# Patient Record
Sex: Male | Born: 1960 | Race: White | Hispanic: No | Marital: Married | Smoking: Current every day smoker
Health system: Southern US, Community
[De-identification: ages and names within clinical notes are randomized; demographics above are authoritative.]

## PROBLEM LIST (undated history)

## (undated) DIAGNOSIS — E669 Obesity, unspecified: Secondary | ICD-10-CM

## (undated) DIAGNOSIS — R5381 Other malaise: Secondary | ICD-10-CM

## (undated) DIAGNOSIS — R079 Chest pain, unspecified: Secondary | ICD-10-CM

## (undated) DIAGNOSIS — J4489 Other specified chronic obstructive pulmonary disease: Secondary | ICD-10-CM

## (undated) DIAGNOSIS — M79609 Pain in unspecified limb: Secondary | ICD-10-CM

## (undated) DIAGNOSIS — F329 Major depressive disorder, single episode, unspecified: Secondary | ICD-10-CM

## (undated) DIAGNOSIS — R519 Headache, unspecified: Secondary | ICD-10-CM

## (undated) DIAGNOSIS — K3189 Other diseases of stomach and duodenum: Secondary | ICD-10-CM

## (undated) DIAGNOSIS — N189 Chronic kidney disease, unspecified: Secondary | ICD-10-CM

## (undated) DIAGNOSIS — F528 Other sexual dysfunction not due to a substance or known physiological condition: Secondary | ICD-10-CM

## (undated) DIAGNOSIS — E663 Overweight: Secondary | ICD-10-CM

## (undated) DIAGNOSIS — R062 Wheezing: Secondary | ICD-10-CM

## (undated) DIAGNOSIS — R5383 Other fatigue: Secondary | ICD-10-CM

## (undated) DIAGNOSIS — F3289 Other specified depressive episodes: Secondary | ICD-10-CM

## (undated) DIAGNOSIS — E785 Hyperlipidemia, unspecified: Secondary | ICD-10-CM

## (undated) DIAGNOSIS — I10 Essential (primary) hypertension: Secondary | ICD-10-CM

## (undated) DIAGNOSIS — L989 Disorder of the skin and subcutaneous tissue, unspecified: Secondary | ICD-10-CM

## (undated) DIAGNOSIS — R7989 Other specified abnormal findings of blood chemistry: Secondary | ICD-10-CM

## (undated) DIAGNOSIS — Z87442 Personal history of urinary calculi: Secondary | ICD-10-CM

## (undated) DIAGNOSIS — R1013 Epigastric pain: Secondary | ICD-10-CM

## (undated) DIAGNOSIS — F17200 Nicotine dependence, unspecified, uncomplicated: Secondary | ICD-10-CM

## (undated) DIAGNOSIS — B354 Tinea corporis: Secondary | ICD-10-CM

## (undated) DIAGNOSIS — J449 Chronic obstructive pulmonary disease, unspecified: Secondary | ICD-10-CM

## (undated) DIAGNOSIS — R51 Headache: Secondary | ICD-10-CM

## (undated) HISTORY — DX: Nicotine dependence, unspecified, uncomplicated: F17.200

## (undated) HISTORY — DX: Chest pain, unspecified: R07.9

## (undated) HISTORY — DX: Other fatigue: R53.83

## (undated) HISTORY — DX: Essential (primary) hypertension: I10

## (undated) HISTORY — DX: Other malaise: R53.81

## (undated) HISTORY — DX: Hyperlipidemia, unspecified: E78.5

## (undated) HISTORY — DX: Disorder of the skin and subcutaneous tissue, unspecified: L98.9

## (undated) HISTORY — DX: Other specified depressive episodes: F32.89

## (undated) HISTORY — DX: Tinea corporis: B35.4

## (undated) HISTORY — DX: Other specified abnormal findings of blood chemistry: R79.89

## (undated) HISTORY — DX: Wheezing: R06.2

## (undated) HISTORY — DX: Other sexual dysfunction not due to a substance or known physiological condition: F52.8

## (undated) HISTORY — DX: Obesity, unspecified: E66.9

## (undated) HISTORY — DX: Overweight: E66.3

## (undated) HISTORY — DX: Other diseases of stomach and duodenum: K31.89

## (undated) HISTORY — DX: Chronic obstructive pulmonary disease, unspecified: J44.9

## (undated) HISTORY — DX: Pain in unspecified limb: M79.609

## (undated) HISTORY — DX: Other specified chronic obstructive pulmonary disease: J44.89

## (undated) HISTORY — DX: Major depressive disorder, single episode, unspecified: F32.9

## (undated) HISTORY — DX: Other diseases of stomach and duodenum: R10.13

---

## 1981-04-14 HISTORY — PX: OTHER SURGICAL HISTORY: SHX169

## 1983-04-15 HISTORY — PX: ANAL FISSURE REPAIR: SHX2312

## 1998-01-04 ENCOUNTER — Emergency Department (HOSPITAL_COMMUNITY): Admission: EM | Admit: 1998-01-04 | Discharge: 1998-01-04 | Payer: Self-pay | Admitting: Emergency Medicine

## 1998-01-04 ENCOUNTER — Encounter: Payer: Self-pay | Admitting: Emergency Medicine

## 1998-02-12 ENCOUNTER — Encounter: Payer: Self-pay | Admitting: Emergency Medicine

## 1998-02-12 ENCOUNTER — Emergency Department (HOSPITAL_COMMUNITY): Admission: EM | Admit: 1998-02-12 | Discharge: 1998-02-12 | Payer: Self-pay | Admitting: Emergency Medicine

## 1998-09-05 ENCOUNTER — Emergency Department (HOSPITAL_COMMUNITY): Admission: EM | Admit: 1998-09-05 | Discharge: 1998-09-05 | Payer: Self-pay | Admitting: Emergency Medicine

## 2010-07-18 ENCOUNTER — Other Ambulatory Visit: Payer: Self-pay | Admitting: Orthopedic Surgery

## 2010-07-18 ENCOUNTER — Ambulatory Visit (HOSPITAL_BASED_OUTPATIENT_CLINIC_OR_DEPARTMENT_OTHER)
Admission: RE | Admit: 2010-07-18 | Discharge: 2010-07-18 | Disposition: A | Discharge status: Home / Self Care | Payer: Self-pay | Source: Ambulatory Visit | Attending: Orthopedic Surgery | Admitting: Orthopedic Surgery

## 2010-07-18 DIAGNOSIS — D4819 Other specified neoplasm of uncertain behavior of connective and other soft tissue: Secondary | ICD-10-CM | POA: Insufficient documentation

## 2010-07-18 DIAGNOSIS — D481 Neoplasm of uncertain behavior of connective and other soft tissue: Secondary | ICD-10-CM | POA: Insufficient documentation

## 2012-07-29 ENCOUNTER — Other Ambulatory Visit: Payer: Self-pay

## 2012-07-30 ENCOUNTER — Encounter: Payer: Self-pay | Admitting: *Deleted

## 2012-08-02 ENCOUNTER — Ambulatory Visit (INDEPENDENT_AMBULATORY_CARE_PROVIDER_SITE_OTHER): Payer: Self-pay | Admitting: Internal Medicine

## 2012-08-02 ENCOUNTER — Encounter: Payer: Self-pay | Admitting: Internal Medicine

## 2012-08-02 VITALS — BP 160/92 | HR 93 | Temp 97.8°F | Resp 22 | Ht 73.0 in | Wt 253.0 lb

## 2012-08-02 DIAGNOSIS — F17200 Nicotine dependence, unspecified, uncomplicated: Secondary | ICD-10-CM

## 2012-08-02 DIAGNOSIS — B369 Superficial mycosis, unspecified: Secondary | ICD-10-CM

## 2012-08-02 DIAGNOSIS — K3189 Other diseases of stomach and duodenum: Secondary | ICD-10-CM

## 2012-08-02 DIAGNOSIS — F3289 Other specified depressive episodes: Secondary | ICD-10-CM

## 2012-08-02 DIAGNOSIS — E669 Obesity, unspecified: Secondary | ICD-10-CM

## 2012-08-02 DIAGNOSIS — R1013 Epigastric pain: Secondary | ICD-10-CM | POA: Insufficient documentation

## 2012-08-02 DIAGNOSIS — F329 Major depressive disorder, single episode, unspecified: Secondary | ICD-10-CM

## 2012-08-02 DIAGNOSIS — E785 Hyperlipidemia, unspecified: Secondary | ICD-10-CM

## 2012-08-02 DIAGNOSIS — E538 Deficiency of other specified B group vitamins: Secondary | ICD-10-CM

## 2012-08-02 DIAGNOSIS — I10 Essential (primary) hypertension: Secondary | ICD-10-CM

## 2012-08-02 MED ORDER — CLOTRIMAZOLE-BETAMETHASONE 1-0.05 % EX CREA: TOPICAL_CREAM | Freq: Two times a day (BID) | CUTANEOUS | Status: DC

## 2012-08-02 MED ORDER — HYDROCHLOROTHIAZIDE 12.5 MG PO TABS: 12.5000 mg | ORAL_TABLET | Freq: Every day | ORAL | Status: DC

## 2012-08-02 MED ORDER — PRAVASTATIN SODIUM 40 MG PO TABS: 40.0000 mg | ORAL_TABLET | Freq: Every day | ORAL | Status: DC

## 2012-08-02 MED ORDER — AMLODIPINE BESYLATE 10 MG PO TABS: 10.0000 mg | ORAL_TABLET | Freq: Every day | ORAL | Status: DC

## 2012-08-02 NOTE — Progress Notes (Signed)
Patient ID: Tom Lane, male   DOB: 1961-02-11, 52 y.o.   MRN: [id number] Code Status:  full  Allergies  Allergen Reactions  . Codeine     Chief Complaint  Patient presents with  . Medical Managment of Chronic Issues    has a raw place behind right ear    HPI: Patient is a 52 y.o. seen in the office today for chronic medical management.   Rash behind right ear since eyeglasses rubbed against it a couple years ago.  Has tried vaseline intensive care.    Actively smoking.  A lot less than a ppd now.  Mostly smokes in social setting.    Does watch his sodium intake and avoids fried foods.    Review of Systems:  Review of Systems  Constitutional: Negative for fever, chills and weight loss.  HENT: Negative for congestion.        Takes excedrin frequently.    Eyes: Negative for blurred vision.       Wears glasses  Respiratory: Negative for shortness of breath.   Cardiovascular: Negative for chest pain, palpitations and leg swelling.  Gastrointestinal: Positive for heartburn. Negative for constipation.  Genitourinary: Negative for dysuria, urgency and frequency.       Nocturia  Musculoskeletal: Positive for joint pain. Negative for falls.       Chronic right shoulder pain--h/o dislocation  Skin: Positive for rash.  Neurological: Positive for headaches. Negative for dizziness and weakness.  Endo/Heme/Allergies: Does not bruise/bleed easily.  Psychiatric/Behavioral: Negative for depression, memory loss and substance abuse. The patient is not nervous/anxious.    Past Medical History  Diagnosis Date  . Pain in limb   . Other abnormal blood chemistry   . Overweight   . Unspecified disorder of skin and subcutaneous tissue   . Chronic airway obstruction, not elsewhere classified   . Dermatophytosis of the body   . Wheezing   . Obesity, unspecified   . Other and unspecified hyperlipidemia   . Tobacco use disorder   . Unspecified essential hypertension   . Depressive disorder, not  elsewhere classified   . Psychosexual dysfunction with inhibited sexual excitement   . Other malaise and fatigue   . Chest pain, unspecified   . Dyspepsia and other specified disorders of function of stomach    Past Surgical History  Procedure Laterality Date  . Right renal stone removed  1983    Dr Aldean Ast  . Anal fissure repair  1985   Social History:   reports that he has been smoking Cigarettes.  He has a 45 pack-year smoking history. He does not have any smokeless tobacco history on file. He reports that  drinks alcohol. He reports that he does not use illicit drugs.  No family history on file.  Medications: Patient's Medications  New Prescriptions   No medications on file  Previous Medications   ASPIRIN 81 MG TABLET    Take 81 mg by mouth daily. Take one tablet once a day   CYANOCOBALAMIN 1000 MCG TABLET    Take 100 mcg by mouth daily. Take one tablet once a day   GARLIC 100 MG TABS    Take by mouth. Take one tablet once a day   MULTIVITAMIN (ONE-A-DAY MEN'S) TABS    Take 1 tablet by mouth daily. Take one tablet once a day   OMEGA-3 FATTY ACIDS (FISH OIL) 1000 MG CAPS    Take by mouth. Take one capsule twice a day   RED YEAST RICE  EXTRACT 600 MG TABS    Take by mouth. Take one tablet once a day  Modified Medications   Modified Medication Previous Medication   AMLODIPINE (NORVASC) 10 MG TABLET amLODipine (NORVASC) 10 MG tablet      Take 1 tablet (10 mg total) by mouth daily. Take one tablet once a day for blood pressure    Take 10 mg by mouth daily. Take one tablet once a day for blood pressure   PRAVASTATIN (PRAVACHOL) 40 MG TABLET pravastatin (PRAVACHOL) 40 MG tablet      Take 1 tablet (40 mg total) by mouth daily. Take one tablet once a day for cholesterol    Take 40 mg by mouth daily. Take one tablet once a day for cholesterol  Discontinued Medications   NYSTATIN-TRIAMCINOLONE (MYCOLOG II) CREAM    Apply topically 4 (four) times daily. Apply to infected area twice a day  until resolution   Physical Exam: Filed Vitals:   08/02/12 1517  BP: 160/92  Pulse: 93  Temp: 97.8 F (36.6 C)  TempSrc: Oral  Resp: 22  Height: 6\' 1"  (1.854 m)  Weight: 253 lb (114.76 kg)  SpO2: 96%   Physical Exam  Constitutional: He is oriented to person, place, and time.  HENT:  Head: Normocephalic and atraumatic.  Left Ear: External ear normal.  Nose: Nose normal.  Mouth/Throat: Oropharynx is clear and moist. No oropharyngeal exudate.  Right ear with excoriated area with white scaly material present posterior auricular area and some at external ear canal  Eyes: EOM are normal. Pupils are equal, round, and reactive to light.  Cardiovascular: Normal rate, regular rhythm, normal heart sounds and intact distal pulses.   Pulmonary/Chest: Effort normal and breath sounds normal. No respiratory distress.  Abdominal: Soft. Bowel sounds are normal. He exhibits no distension.  Musculoskeletal: He exhibits no edema.  Decreased rom shoulder  Neurological: He is alert and oriented to person, place, and time. No cranial nerve deficit.  Skin: Skin is warm and dry. Rash noted.  See ear above    Assessment/Plan 1. Obesity, unspecified -encouraged diet and exercise regimen for weight loss - check Hemoglobin A1c to ensure he is not prediabetic or diabetic  2. Other and unspecified hyperlipidemia -on red rice yeast tablets right now, pravachol and fish oil - check Lipid panel -advised that I did not recommend both red rice yeast and pravachol b/c of the same active ingredient increasing his risk of myopathy and transaminitis  3. Tobacco use disorder -advised slow taper off cigarettes, was not ready to quit, consider wellbutrin in future to help mood and smoking cessation - CBC with Differential  4. Unspecified essential hypertension - add hctz to amlodipine - hydrochlorothiazide (HYDRODIURIL) 12.5 MG tablet; Take 1 tablet (12.5 mg total) by mouth daily.  Dispense: 90 tablet; Refill:  3 - Comprehensive metabolic panel  5. Depressive disorder, not elsewhere classified -not currently on medications --consider SSRI if mood remains poor at future visit  6. Dyspepsia and other specified disorders of function of stomach --recommended as needed zantac after attempting to avoid triggers, avoid eating or drinking w/in a couple of hours of bedtime and elevating HOB  7. Chronic fungal otitis externa Rash behind ear - clotrimazole-betamethasone (LOTRISONE) cream; Apply topically 2 (two) times daily.  Dispense: 30 g; Refill: 3 -if does not improve, should see derm to r/o skin cancer due to longstanding presence  8. B12 deficiency Check level - Vitamin B12  Labs/tests ordered:  Cbc, cmp, flp, b12, hba1c

## 2012-08-03 LAB — COMPREHENSIVE METABOLIC PANEL
ALT: 30 IU/L (ref 0–44)
AST: 18 IU/L (ref 0–40)
Albumin/Globulin Ratio: 2 (ref 1.1–2.5)
Albumin: 4.5 g/dL (ref 3.5–5.5)
Alkaline Phosphatase: 66 IU/L (ref 39–117)
BUN/Creatinine Ratio: 21 — ABNORMAL HIGH (ref 9–20)
BUN: 19 mg/dL (ref 6–24)
CO2: 23 mmol/L (ref 19–28)
Calcium: 10.6 mg/dL — ABNORMAL HIGH (ref 8.7–10.2)
Chloride: 103 mmol/L (ref 97–108)
Creatinine, Ser: 0.91 mg/dL (ref 0.76–1.27)
GFR calc Af Amer: 112 mL/min/{1.73_m2} (ref >59)
GFR calc non Af Amer: 97 mL/min/{1.73_m2} (ref >59)
Globulin, Total: 2.2 g/dL (ref 1.5–4.5)
Glucose: 81 mg/dL (ref 65–99)
Potassium: 3.9 mmol/L (ref 3.5–5.2)
Sodium: 142 mmol/L (ref 134–144)
Total Bilirubin: 0.4 mg/dL (ref 0.0–1.2)
Total Protein: 6.7 g/dL (ref 6.0–8.5)

## 2012-08-03 LAB — CBC WITH DIFFERENTIAL/PLATELET
Basophils Absolute: 0 10*3/uL (ref 0.0–0.2)
Basos: 0 % (ref 0–3)
Eos: 3 % (ref 0–5)
Eosinophils Absolute: 0.2 10*3/uL (ref 0.0–0.4)
HCT: 48.3 % (ref 37.5–51.0)
Hemoglobin: 16.9 g/dL (ref 12.6–17.7)
Immature Grans (Abs): 0 10*3/uL (ref 0.0–0.1)
Immature Granulocytes: 0 % (ref 0–2)
Lymphocytes Absolute: 2.4 10*3/uL (ref 0.7–3.1)
Lymphs: 29 % (ref 14–46)
MCH: 31.4 pg (ref 26.6–33.0)
MCHC: 35 g/dL (ref 31.5–35.7)
MCV: 90 fL (ref 79–97)
Monocytes Absolute: 0.9 10*3/uL (ref 0.1–0.9)
Monocytes: 11 % (ref 4–12)
Neutrophils Absolute: 4.8 10*3/uL (ref 1.4–7.0)
Neutrophils Relative %: 57 % (ref 40–74)
RBC: 5.38 x10E6/uL (ref 4.14–5.80)
RDW: 14.3 % (ref 12.3–15.4)
WBC: 8.3 10*3/uL (ref 3.4–10.8)

## 2012-08-03 LAB — LIPID PANEL
Chol/HDL Ratio: 4.5 ratio units (ref 0.0–5.0)
Cholesterol, Total: 242 mg/dL — ABNORMAL HIGH (ref 100–199)
HDL: 54 mg/dL (ref >39)
LDL Calculated: 150 mg/dL — ABNORMAL HIGH (ref 0–99)
Triglycerides: 189 mg/dL — ABNORMAL HIGH (ref 0–149)
VLDL Cholesterol Cal: 38 mg/dL (ref 5–40)

## 2012-08-03 LAB — HEMOGLOBIN A1C
Est. average glucose Bld gHb Est-mCnc: 111 mg/dL
Hgb A1c MFr Bld: 5.5 % (ref 4.8–5.6)

## 2012-08-03 LAB — VITAMIN B12: Vitamin B-12: 930 pg/mL (ref 211–946)

## 2012-08-04 ENCOUNTER — Other Ambulatory Visit: Payer: Self-pay | Admitting: *Deleted

## 2012-08-04 MED ORDER — PRAVASTATIN SODIUM 80 MG PO TABS: ORAL_TABLET | ORAL | Status: DC

## 2012-09-13 ENCOUNTER — Other Ambulatory Visit: Payer: Self-pay | Admitting: *Deleted

## 2012-09-13 MED ORDER — PRAVASTATIN SODIUM 80 MG PO TABS: ORAL_TABLET | ORAL | Status: DC

## 2013-03-09 ENCOUNTER — Ambulatory Visit (INDEPENDENT_AMBULATORY_CARE_PROVIDER_SITE_OTHER): Payer: BC Managed Care – PPO | Admitting: Nurse Practitioner

## 2013-03-09 ENCOUNTER — Encounter: Payer: Self-pay | Admitting: Nurse Practitioner

## 2013-03-09 VITALS — BP 134/84 | HR 68 | Temp 98.3°F | Resp 20 | Ht 73.0 in | Wt 227.0 lb

## 2013-03-09 DIAGNOSIS — E669 Obesity, unspecified: Secondary | ICD-10-CM

## 2013-03-09 DIAGNOSIS — E785 Hyperlipidemia, unspecified: Secondary | ICD-10-CM

## 2013-03-09 DIAGNOSIS — F17200 Nicotine dependence, unspecified, uncomplicated: Secondary | ICD-10-CM

## 2013-03-09 DIAGNOSIS — I10 Essential (primary) hypertension: Secondary | ICD-10-CM

## 2013-03-09 NOTE — Patient Instructions (Addendum)
-  Decrease salt intake -Exercise 30 mins/5 days a week is recommended  -heart healthy diet - try to set a goal for only half a pack a day by a date and work towards that -if weight loss conts follow up sooner

## 2013-03-09 NOTE — Progress Notes (Signed)
Patient ID: Tom Lane, male   DOB: Apr 19, 1960, 52 y.o.   MRN: [id number]    Allergies  Allergen Reactions  . Codeine     Chief Complaint  Patient presents with  . Medical Managment of Chronic Issues    check blood pressure, was given 2 new medications last visit, wants to know if he still  needs them.  . Weight Loss    lost weight without trying. 08/02/12 wt was 253  . Herpes Zoster    went to Urgent Care-rash on chest around to back     HPI: Patient is a 52 y.o. male seen in the office today for routine follow up Has been eating better but eating more; cut back on fried foods reports it has been more in the last 2 weeks but reports he has had increase stress in his life and had weight loss last time this happened (wife having an affair) No increase in activity- exercise just includes parking further away and walking Still smoking- 1/2-1 ppd; does not want to quit smoking at this time but has decrease his amount  Review of Systems:  Review of Systems  Constitutional: Negative for fever, chills and weight loss.  HENT: Negative for congestion.   Eyes: Negative for blurred vision.       Wears glasses  Respiratory: Negative for shortness of breath.   Cardiovascular: Negative for chest pain, palpitations and leg swelling.  Gastrointestinal: Negative for heartburn, abdominal pain, diarrhea and constipation.  Genitourinary: Negative for dysuria, urgency and frequency.       Nocturia x1   Neurological: Negative for dizziness, weakness and headaches (headaches have improved).       Pain from shingles  Endo/Heme/Allergies: Does not bruise/bleed easily.  Psychiatric/Behavioral: Negative for depression, memory loss and substance abuse. The patient is not nervous/anxious.      Past Medical History  Diagnosis Date  . Pain in limb   . Other abnormal blood chemistry   . Overweight(278.02)   . Unspecified disorder of skin and subcutaneous tissue   . Chronic airway obstruction, not  elsewhere classified   . Dermatophytosis of the body   . Wheezing   . Obesity, unspecified   . Other and unspecified hyperlipidemia   . Tobacco use disorder   . Unspecified essential hypertension   . Depressive disorder, not elsewhere classified   . Psychosexual dysfunction with inhibited sexual excitement   . Other malaise and fatigue   . Chest pain, unspecified   . Dyspepsia and other specified disorders of function of stomach    Past Surgical History  Procedure Laterality Date  . Right renal stone removed  1983    Dr Aldean Ast  . Anal fissure repair  1985   Social History:   reports that he has been smoking Cigarettes.  He has a 30 pack-year smoking history. He has never used smokeless tobacco. He reports that he drinks alcohol. He reports that he does not use illicit drugs.  History reviewed. No pertinent family history.  Medications: Patient's Medications  New Prescriptions   No medications on file  Previous Medications   AMLODIPINE (NORVASC) 10 MG TABLET    Take 1 tablet (10 mg total) by mouth daily. Take one tablet once a day for blood pressure   ASPIRIN 81 MG TABLET    Take 81 mg by mouth daily. Take one tablet once a day   CLOTRIMAZOLE-BETAMETHASONE (LOTRISONE) CREAM    Apply topically 2 (two) times daily.   CYANOCOBALAMIN 1000 MCG  TABLET    Take 100 mcg by mouth daily. Take one tablet once a day   GARLIC 100 MG TABS    Take by mouth. Take one tablet once a day   HYDROCHLOROTHIAZIDE (HYDRODIURIL) 12.5 MG TABLET    Take 1 tablet (12.5 mg total) by mouth daily.   MULTIVITAMIN (ONE-A-DAY MEN'S) TABS    Take 1 tablet by mouth daily. Take one tablet once a day   OMEGA-3 FATTY ACIDS (FISH OIL) 1000 MG CAPS    Take by mouth. Take one capsule twice a day   PRAVASTATIN (PRAVACHOL) 80 MG TABLET    Take one tablet once daily to lower cholesterol   RED YEAST RICE EXTRACT 600 MG TABS    Take by mouth. Take one tablet once a day  Modified Medications   No medications on file    Discontinued Medications   No medications on file     Physical Exam:  Filed Vitals:   03/09/13 1013  BP: 134/84  Pulse: 68  Temp: 98.3 F (36.8 C)  TempSrc: Oral  Height: 6\' 1"  (1.854 m)  Weight: 227 lb (102.967 kg)    Physical Exam  Constitutional: He is oriented to person, place, and time and well-developed, well-nourished, and in no distress. No distress.  HENT:  Head: Normocephalic and atraumatic.  Mouth/Throat: Oropharynx is clear and moist. No oropharyngeal exudate.  Neck: Normal range of motion. Neck supple. No thyromegaly present.  Cardiovascular: Normal rate, regular rhythm and normal heart sounds.   Pulmonary/Chest: Effort normal and breath sounds normal.  Abdominal: Soft. Bowel sounds are normal. He exhibits no distension.  Musculoskeletal: He exhibits no edema and no tenderness.  Lymphadenopathy:    He has no cervical adenopathy.  Neurological: He is alert and oriented to person, place, and time.  Skin: Skin is warm and dry. He is not diaphoretic.  Psychiatric: Affect normal.     Labs reviewed: Basic Metabolic Panel:  Recent Labs  16/10/96 1629  NA 142  K 3.9  CL 103  CO2 23  GLUCOSE 81  BUN 19  CREATININE 0.91  CALCIUM 10.6*   Liver Function Tests:  Recent Labs  08/02/12 1629  AST 18  ALT 30  ALKPHOS 66  BILITOT 0.4  PROT 6.7   No results found for this basename: LIPASE, AMYLASE,  in the last 8760 hours No results found for this basename: AMMONIA,  in the last 8760 hours CBC:  Recent Labs  08/02/12 1629  WBC 8.3  NEUTROABS 4.8  HGB 16.9  HCT 48.3  MCV 90   Lipid Panel:  Recent Labs  08/02/12 1629  HDL 54  LDLCALC 150*  TRIG 189*  CHOLHDL 4.5   TSH: No results found for this basename: TSH,  in the last 8760 hours A1C: No components found with this basename: A1C,    Assessment/Plan 1. Unspecified essential hypertension -blood pressure at goal -cont heart healthy low sodium diet  - Comprehensive metabolic  panel  2. Other and unspecified hyperlipidemia -cont low fat diet -exercise discussed  -cont red yeast rice and simvastatin - Comprehensive metabolic panel - Lipid panel  3. Obesity, unspecified -has lost more weight in the last last 2 weeks -cont diet and exercise -if increased weight loss conts and pt has concerns to follow up sooner than 6 months  4. Tobacco use disorder -to set a goal to decrease amount of cigarettes per day (ie 1/2 pack a day) and work on making that goal -think about cessation  To follow up in 6 months or sooner if needed

## 2013-03-10 LAB — COMPREHENSIVE METABOLIC PANEL
AST: 17 IU/L (ref 0–40)
Albumin: 4.4 g/dL (ref 3.5–5.5)
Alkaline Phosphatase: 61 IU/L (ref 39–117)
BUN/Creatinine Ratio: 16 (ref 9–20)
BUN: 15 mg/dL (ref 6–24)
CO2: 22 mmol/L (ref 18–29)
Calcium: 10.4 mg/dL — ABNORMAL HIGH (ref 8.7–10.2)
Chloride: 103 mmol/L (ref 97–108)
Creatinine, Ser: 0.93 mg/dL (ref 0.76–1.27)
Glucose: 96 mg/dL (ref 65–99)

## 2013-03-10 LAB — LIPID PANEL
Cholesterol, Total: 142 mg/dL (ref 100–199)
HDL: 42 mg/dL (ref >39)
LDL Calculated: 84 mg/dL (ref 0–99)
Triglycerides: 79 mg/dL (ref 0–149)

## 2013-03-14 ENCOUNTER — Encounter: Payer: Self-pay | Admitting: *Deleted

## 2013-06-09 ENCOUNTER — Other Ambulatory Visit: Payer: Self-pay | Admitting: Internal Medicine

## 2013-11-22 ENCOUNTER — Other Ambulatory Visit: Payer: Self-pay | Admitting: Internal Medicine

## 2014-01-27 ENCOUNTER — Other Ambulatory Visit: Payer: Self-pay

## 2014-03-20 ENCOUNTER — Encounter: Payer: Self-pay | Admitting: Internal Medicine

## 2014-03-20 ENCOUNTER — Ambulatory Visit (INDEPENDENT_AMBULATORY_CARE_PROVIDER_SITE_OTHER): Payer: BC Managed Care – PPO | Admitting: Internal Medicine

## 2014-03-20 VITALS — BP 110/76 | HR 52 | Temp 97.5°F | Resp 10 | Ht 73.0 in | Wt 209.0 lb

## 2014-03-20 DIAGNOSIS — E669 Obesity, unspecified: Secondary | ICD-10-CM | POA: Diagnosis not present

## 2014-03-20 DIAGNOSIS — E785 Hyperlipidemia, unspecified: Secondary | ICD-10-CM

## 2014-03-20 DIAGNOSIS — R634 Abnormal weight loss: Secondary | ICD-10-CM | POA: Diagnosis not present

## 2014-03-20 DIAGNOSIS — Z23 Encounter for immunization: Secondary | ICD-10-CM

## 2014-03-20 DIAGNOSIS — F17200 Nicotine dependence, unspecified, uncomplicated: Secondary | ICD-10-CM

## 2014-03-20 DIAGNOSIS — Z720 Tobacco use: Secondary | ICD-10-CM

## 2014-03-20 DIAGNOSIS — J449 Chronic obstructive pulmonary disease, unspecified: Secondary | ICD-10-CM | POA: Diagnosis not present

## 2014-03-20 DIAGNOSIS — I10 Essential (primary) hypertension: Secondary | ICD-10-CM

## 2014-03-20 DIAGNOSIS — D2239 Melanocytic nevi of other parts of face: Secondary | ICD-10-CM

## 2014-03-20 DIAGNOSIS — D2339 Other benign neoplasm of skin of other parts of face: Secondary | ICD-10-CM

## 2014-03-20 MED ORDER — PRAVASTATIN SODIUM 80 MG PO TABS: ORAL_TABLET | ORAL | Status: DC

## 2014-03-20 MED ORDER — ZOSTER VACCINE LIVE 19400 UNT/0.65ML ~~LOC~~ SOLR: 0.6500 mL | Freq: Once | SUBCUTANEOUS | Status: DC

## 2014-03-20 MED ORDER — AMLODIPINE BESYLATE 10 MG PO TABS: 10.0000 mg | ORAL_TABLET | Freq: Every day | ORAL | Status: DC

## 2014-03-20 MED ORDER — BUPROPION HCL ER (XL) 150 MG PO TB24: 150.0000 mg | ORAL_TABLET | Freq: Every day | ORAL | Status: DC

## 2014-03-20 NOTE — Patient Instructions (Addendum)
Stop hydrochlorothiazide Check bp daily and let me know if it is then running consistently over 130/80  Start wellbutrin daily to help with smoking cessation  Please do a record release for records from Dr. Ival Bible office.

## 2014-03-20 NOTE — Progress Notes (Signed)
Patient ID: Tom Lane, male   DOB: 1960-09-01, 53 y.o.   MRN: [id number]   Location:  [facility] [facility]  Code Status: full code, does not have advance directive at this time  Allergies  Allergen Reactions  . Codeine     Chief Complaint  Patient presents with  . Establish Care    Re-establish and renew medications, was seeing Dr.Dewey . Patient is fasting if any labs due     HPI: Patient is a 53 y.o. male seen in the office today to reestablish with the practice.  He had a physical less than a year ago so we cannot do that today.   Questions need for hctz.  Had previously been drinking sodas but has quit them so would like to try stopping his hctz. Drinks mostly water, coffee and beer, but less beer.  Longtime family pet was put down last night and nobody slept. He also stopped eating junk.  Used to drink only diet mountain dew, sweet tea and coffee.  No longer drinks fluids constantly.  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    No longer takes niacin.  Was wetting the bed from sweat from the niacin--was flushing and diaphoretic.  Is NOT any longer. BP at home 110s-120s.    Requests prevnar vaccine.    Is fasting this am.  Left temple mole.  Square shaped bluish mole.    Potentially trying low dose wellbutrin for smoking cessation.  Smokes less and smokes outside primarily.  His wife also smokes less after getting really sick.  His daughter notes that she tolerates it well and neither she nor her father tolerated ssri (he was on zoloft).    Only 2-3 beers per week.  His daughter, Joellen Jersey will check his bp when we stop hctz  Review of Systems:  Review of Systems  Constitutional: Positive for weight loss.  HENT:       Dentures upcoming  Eyes:       Wears corrective lenses  Respiratory: Positive for wheezing.   Cardiovascular:       Hypertension, recently controlled very well  Gastrointestinal:       Flatulence and  hemorroids  Genitourinary: Negative for dysuria.       Weak stream  Musculoskeletal:       Restricted ROM  Skin:       Nail abnormality  Neurological: Positive for speech change.  Endo/Heme/Allergies: Positive for polydipsia.       Polyuria of large volumes     Past Medical History  Diagnosis Date  . Pain in limb   . Other abnormal blood chemistry   . Overweight(278.02)   . Unspecified disorder of skin and subcutaneous tissue   . Chronic airway obstruction, not elsewhere classified   . Dermatophytosis of the body   . Wheezing   . Obesity, unspecified   . Other and unspecified hyperlipidemia   . Tobacco use disorder   . Unspecified essential hypertension   . Depressive disorder, not elsewhere classified   . Psychosexual dysfunction with inhibited sexual excitement   . Other malaise and fatigue   . Chest pain, unspecified   . Dyspepsia and other specified disorders of function of stomach     Past Surgical History  Procedure Laterality Date  . Right renal stone removed  1983    Dr Serita Butcher  . Anal fissure repair  1985    Social History:  reports that he has been smoking Cigarettes.  He has a 40 pack-year smoking history. He has never used smokeless tobacco. He reports that he drinks alcohol. He reports that he does not use illicit drugs.  Family History  Problem Relation Age of Onset  . Diabetes Father   . Diabetes Sister   . Stroke Sister   . Stroke Father     Medications: Patient's Medications  New Prescriptions   No medications on file  Previous Medications   AMLODIPINE (NORVASC) 10 MG TABLET    Take 1 tablet (10 mg total) by mouth daily. Take one tablet once a day for blood pressure   ASPIRIN 81 MG TABLET    Take 81 mg by mouth daily. Take one tablet once a day   GARLIC 2297 MG CAPS    Take by mouth daily.   HYDROCHLOROTHIAZIDE (HYDRODIURIL) 12.5 MG TABLET    TAKE ONE TABLET BY MOUTH ONCE DAILY   MULTIVITAMIN (ONE-A-DAY MEN'S) TABS    Take 1 tablet by  mouth daily. Take one tablet once a day   OMEGA-3 FATTY ACIDS (FISH OIL) 1200 MG CAPS    Take by mouth daily.   PRAVASTATIN (PRAVACHOL) 80 MG TABLET    Take one tablet once daily to lower cholesterol   RED YEAST RICE EXTRACT (RED YEAST RICE PO)    Take 1,200 mg by mouth daily.  Modified Medications   No medications on file  Discontinued Medications   CLOTRIMAZOLE-BETAMETHASONE (LOTRISONE) CREAM    Apply topically 2 (two) times daily.   CYANOCOBALAMIN 1000 MCG TABLET    Take 100 mcg by mouth daily. Take one tablet once a day   GARLIC 989 MG TABS    Take by mouth. Take one tablet once a day   OMEGA-3 FATTY ACIDS (FISH OIL) 1000 MG CAPS    Take by mouth. Take one capsule twice a day   RED YEAST RICE EXTRACT 600 MG TABS    Take by mouth. Take one tablet once a day     Physical Exam: Filed Vitals:   03/20/14 0845  BP: 110/76  Pulse: 52  Temp: 97.5 F (36.4 C)  TempSrc: Oral  Resp: 10  Height: 6\' 1"  (1.854 m)  Weight: 209 lb (94.802 kg)  SpO2: 98%  Physical Exam  Constitutional: He is oriented to person, place, and time. He appears well-developed and well-nourished. No distress.  Eyes:  Wears glasses  Cardiovascular: Normal rate, regular rhythm, normal heart sounds and intact distal pulses.   Pulmonary/Chest: Effort normal and breath sounds normal. No respiratory distress.  Abdominal: Soft. Bowel sounds are normal. He exhibits no distension and no mass. There is no tenderness.  Musculoskeletal: Normal range of motion.  Neurological: He is alert and oriented to person, place, and time.  Skin:  Left temple square raised nevus  Psychiatric: He has a normal mood and affect.    Labs reviewed: Lab Results  Component Value Date   HGBA1C 5.5 08/02/2012   Assessment/Plan 1. Essential hypertension, benign -will stop hctz b/c bp running 211H systolic most of the time and he is bothered by polydipsia and polyuria - amLODipine (NORVASC) 10 MG tablet; Take 1 tablet (10 mg total) by mouth  daily. Take one tablet once a day for blood pressure  Dispense: 90 tablet; Refill: 1  2. Tobacco use disorder - is interested in trying to quit, will start wellbutrin - buPROPion (WELLBUTRIN XL) 150 MG 24 hr tablet; Take 1 tablet (150 mg total) by mouth  daily.  Dispense: 90 tablet; Refill: 1 - Pneumococcal conjugate vaccine 13-valent  3. Hyperlipidemia - cont pravachol, red rice yeast and fish oil and see where lipds are at this point--if very low, would reduce dose due to being on 80mg  pravachol - Hemoglobin A1c - Lipid panel - pravastatin (PRAVACHOL) 80 MG tablet; Take one tablet once daily  Dispense: 90 tablet; Refill: 1  4. Obesity - has lost 46 lbs since April of last year with dietary changes primarily--encouraged to continue these changes especially the decreased soda and tea that contributed - TSH  5. Chronic obstructive pulmonary disease, unspecified COPD, unspecified chronic bronchitis type - was previously diagnosed, but never had PFTs officially -was on spiriva at one time -not currently on any inhalers or therapy for this -smoking cessation encouraged and started on wellbutrin - CBC With differential/Platelet - Comprehensive metabolic panel  6. Atypical nevus of forehead -has changed over the years, and his daughter is especially concerned about it so will refer to derm for full skin assessment and evaluation and treatment of the left temple nevus - CBC With differential/Platelet - Ambulatory referral to Dermatology  7. Loss of weight -has made dietary changes, but also is smoker so monitor carefully--probably should have screening CT and should have PFTs due to probable COPD - CBC With differential/Platelet - TSH  8. Need for vaccination with 13-polyvalent pneumococcal conjugate vaccine - Pneumococcal conjugate vaccine 13-valent given, but refused flu shot  9. Need for vaccination for zoster -Rx given today to get at pharmacy--advised to call his insurance to  check where to get this  Labs/tests ordered:   Orders Placed This Encounter  Procedures  . Pneumococcal conjugate vaccine 13-valent  . CBC With differential/Platelet  . Comprehensive metabolic panel    Order Specific Question:  Has the patient fasted?    Answer:  Yes  . Hemoglobin A1c  . Lipid panel    Order Specific Question:  Has the patient fasted?    Answer:  Yes  . TSH  . Ambulatory referral to Dermatology    Referral Priority:  Routine    Referral Type:  Consultation    Referral Reason:  Specialty Services Required    Requested Specialty:  Dermatology    Number of Visits Requested:  1    Next appt:  3 mos  Janson Lamar L. Elody Kleinsasser, D.O. [facility] [address], [place] [address] Cell Phone (Mon-Fri 8am-5pm):  [phone] On Call:  [phone] & follow prompts after 5pm & weekends Office Phone:  [phone] Office Fax:  [phone]

## 2014-03-21 LAB — COMPREHENSIVE METABOLIC PANEL
ALT: 19 IU/L (ref 0–44)
AST: 19 IU/L (ref 0–40)
Albumin/Globulin Ratio: 2.5 (ref 1.1–2.5)
Albumin: 4.3 g/dL (ref 3.5–5.5)
Alkaline Phosphatase: 71 IU/L (ref 39–117)
BUN/Creatinine Ratio: 20 (ref 9–20)
BUN: 18 mg/dL (ref 6–24)
CO2: 25 mmol/L (ref 18–29)
Calcium: 10.2 mg/dL (ref 8.7–10.2)
Chloride: 102 mmol/L (ref 97–108)
Creatinine, Ser: 0.91 mg/dL (ref 0.76–1.27)
GFR calc Af Amer: 111 mL/min/{1.73_m2} (ref >59)
GFR calc non Af Amer: 96 mL/min/{1.73_m2} (ref >59)
Globulin, Total: 1.7 g/dL (ref 1.5–4.5)
Glucose: 94 mg/dL (ref 65–99)
Potassium: 4 mmol/L (ref 3.5–5.2)
Sodium: 140 mmol/L (ref 134–144)
Total Bilirubin: 0.4 mg/dL (ref 0.0–1.2)
Total Protein: 6 g/dL (ref 6.0–8.5)

## 2014-03-21 LAB — CBC WITH DIFFERENTIAL
Basophils Absolute: 0 10*3/uL (ref 0.0–0.2)
Basos: 0 %
Eos: 4 %
Eosinophils Absolute: 0.3 10*3/uL (ref 0.0–0.4)
HCT: 48.1 % (ref 37.5–51.0)
Hemoglobin: 16.5 g/dL (ref 12.6–17.7)
Immature Grans (Abs): 0 10*3/uL (ref 0.0–0.1)
Immature Granulocytes: 0 %
Lymphocytes Absolute: 2.9 10*3/uL (ref 0.7–3.1)
Lymphs: 35 %
MCH: 31.4 pg (ref 26.6–33.0)
MCHC: 34.3 g/dL (ref 31.5–35.7)
MCV: 92 fL (ref 79–97)
Monocytes Absolute: 0.8 10*3/uL (ref 0.1–0.9)
Monocytes: 10 %
Neutrophils Absolute: 4.2 10*3/uL (ref 1.4–7.0)
Neutrophils Relative %: 51 %
Platelets: 221 10*3/uL (ref 150–379)
RBC: 5.25 x10E6/uL (ref 4.14–5.80)
RDW: 14.1 % (ref 12.3–15.4)
WBC: 8.2 10*3/uL (ref 3.4–10.8)

## 2014-03-21 LAB — HEMOGLOBIN A1C
Est. average glucose Bld gHb Est-mCnc: 108 mg/dL
Hgb A1c MFr Bld: 5.4 % (ref 4.8–5.6)

## 2014-03-21 LAB — LIPID PANEL
Chol/HDL Ratio: 3.5 ratio units (ref 0.0–5.0)
Cholesterol, Total: 188 mg/dL (ref 100–199)
HDL: 54 mg/dL (ref >39)
LDL Calculated: 107 mg/dL — ABNORMAL HIGH (ref 0–99)
Triglycerides: 135 mg/dL (ref 0–149)
VLDL Cholesterol Cal: 27 mg/dL (ref 5–40)

## 2014-03-21 LAB — TSH: TSH: 0.813 u[IU]/mL (ref 0.450–4.500)

## 2014-03-22 ENCOUNTER — Other Ambulatory Visit: Payer: Self-pay | Admitting: *Deleted

## 2014-03-22 ENCOUNTER — Telehealth: Payer: Self-pay | Admitting: *Deleted

## 2014-03-22 DIAGNOSIS — E785 Hyperlipidemia, unspecified: Secondary | ICD-10-CM

## 2014-03-22 MED ORDER — ROSUVASTATIN CALCIUM 10 MG PO TABS: 10.0000 mg | ORAL_TABLET | Freq: Every day | ORAL | Status: DC

## 2014-03-22 NOTE — Telephone Encounter (Signed)
Spoke to the wife regarding patient lab results due to patient being on the road, informed her that Dr. Mariea Clonts would like to start him on a different medication. She stated that they would try crestor instead of lipitor due to cost, she ask if I would call it in to [facility] at the [facility] location. Dr Mariea Clonts , how many mg of crestor would you like to prescribe and when would you like to repeat his lab?

## 2014-03-22 NOTE — Telephone Encounter (Signed)
-----   Message from Gayland Curry, DO sent at 03/21/2014  9:00 AM EST ----- Blood counts, electrolytes, liver, kidneys, sugar average and thyroid are all normal. Bad cholesterol is still high even with pravachol 80mg , fish oil, and red yeast rice.  I would recommend a change in his statin to lipitor or crestor for increased efficacy, but these will cost more.  What does he think?

## 2014-03-22 NOTE — Telephone Encounter (Signed)
crestor 10mg  by mouth at evening meal  Recheck fasting lipid panel in about 6 weeks (can be a little before or after depending on his schedule on the road)

## 2014-05-23 ENCOUNTER — Telehealth: Payer: Self-pay | Admitting: *Deleted

## 2014-05-23 NOTE — Telephone Encounter (Signed)
1.  I changed him to the crestor b/c the cheapest cholesterol pill had been ineffective according to his last lab results.  2.  Do we have samples of the crestor?   2.  If not, change to lipitor (atorvastatin generic is fine) 40mg  at evening meal.

## 2014-05-23 NOTE — Telephone Encounter (Signed)
Patient is out of Cholesterol medication Crestor, but wants something else called in because it costs $45.00 to fill. Please Advise.

## 2014-05-24 MED ORDER — ATORVASTATIN CALCIUM 40 MG PO TABS: ORAL_TABLET | ORAL | Status: DC

## 2014-05-24 NOTE — Telephone Encounter (Signed)
No Samples available. Wants to try the Atorvastatin instead. Faxed to pharmacy

## 2014-06-19 ENCOUNTER — Ambulatory Visit: Payer: BC Managed Care – PPO | Admitting: Internal Medicine

## 2014-07-20 ENCOUNTER — Encounter: Payer: Self-pay | Admitting: Internal Medicine

## 2014-07-20 ENCOUNTER — Ambulatory Visit (INDEPENDENT_AMBULATORY_CARE_PROVIDER_SITE_OTHER): Payer: 59 | Admitting: Internal Medicine

## 2014-07-20 VITALS — BP 126/70 | HR 80 | Temp 97.7°F | Resp 20 | Ht 73.0 in | Wt 211.4 lb

## 2014-07-20 DIAGNOSIS — I10 Essential (primary) hypertension: Secondary | ICD-10-CM

## 2014-07-20 DIAGNOSIS — E785 Hyperlipidemia, unspecified: Secondary | ICD-10-CM

## 2014-07-20 DIAGNOSIS — Z720 Tobacco use: Secondary | ICD-10-CM | POA: Diagnosis not present

## 2014-07-20 DIAGNOSIS — Z1211 Encounter for screening for malignant neoplasm of colon: Secondary | ICD-10-CM

## 2014-07-20 MED ORDER — ATORVASTATIN CALCIUM 40 MG PO TABS: ORAL_TABLET | ORAL | Status: DC

## 2014-07-20 MED ORDER — AMLODIPINE BESYLATE 10 MG PO TABS: 10.0000 mg | ORAL_TABLET | Freq: Every day | ORAL | Status: DC

## 2014-07-20 NOTE — Progress Notes (Signed)
Patient ID: Tom Lane, male   DOB: 06/01/1960, 54 y.o.   MRN: [id number]   Location:  [facility]  Code Status: full code Goals of Care: Advanced Directive information Does patient have an advance directive?: No   Allergies  Allergen Reactions  . Codeine     Chief Complaint  Patient presents with  . Medical Management of Chronic Issues    HPI: Patient is a 54 y.o. white male seen in the office today for medical mgt of his chronic diseases.    Nothing much new.  Couldn't take wellbutrin.  Made him want like a cow.  Wanted to eat everything he saw.  Smoking very little--only a few cigarettes per day.    BP was good today with amlodipine.  Needs cholesterol rechecked.  Had been changed from pravachol to lipitor last time to see if we could get him to goal.  Denture creams burn the roof of his mouth so he's been unable to use them.  Advised to check with pharmacist.    Had his cscope at [facility] many years ago ~10 when he got sick off of the city water.  No polyps.    Still has not seen a dermatologist about the place on his left temple.  Has it finally next week.  Review of Systems:  Review of Systems  Constitutional: Negative for fever and chills.  HENT: Negative for hearing loss.   Eyes: Negative for blurred vision.  Respiratory: Negative for shortness of breath.   Cardiovascular: Negative for chest pain.  Gastrointestinal: Negative for abdominal pain, constipation, blood in stool and melena.  Genitourinary: Positive for frequency. Negative for dysuria and urgency.       Has to get up 2x at night  Musculoskeletal: Negative for myalgias and falls.  Skin:       Left temple nevus  Neurological: Negative for dizziness, loss of consciousness and headaches.  Endo/Heme/Allergies: Does not bruise/bleed easily.  Psychiatric/Behavioral: Negative for depression and memory loss.     Past Medical History  Diagnosis Date  . Pain in limb     . Other abnormal blood chemistry   . Overweight(278.02)   . Unspecified disorder of skin and subcutaneous tissue   . Chronic airway obstruction, not elsewhere classified   . Dermatophytosis of the body   . Wheezing   . Obesity, unspecified   . Other and unspecified hyperlipidemia   . Tobacco use disorder   . Unspecified essential hypertension   . Depressive disorder, not elsewhere classified   . Psychosexual dysfunction with inhibited sexual excitement   . Other malaise and fatigue   . Chest pain, unspecified   . Dyspepsia and other specified disorders of function of stomach     Past Surgical History  Procedure Laterality Date  . Right renal stone removed  1983    Dr Serita Butcher  . Anal fissure repair  1985    Social History:   reports that he has been smoking Cigarettes.  He has a 40 pack-year smoking history. He has never used smokeless tobacco. He reports that he drinks alcohol. He reports that he does not use illicit drugs.  Family History  Problem Relation Age of Onset  . Diabetes Father   . Diabetes Sister   . Stroke Sister   . Stroke Father     Medications: Patient's Medications  New Prescriptions   No medications on file  Previous Medications   AMLODIPINE (NORVASC) 10 MG TABLET  Take 1 tablet (10 mg total) by mouth daily. Take one tablet once a day for blood pressure   ASPIRIN 81 MG TABLET    Take 81 mg by mouth daily. Take one tablet once a day   ATORVASTATIN (LIPITOR) 40 MG TABLET    Take one tablet by mouth once daily for cholesterol   GARLIC 0175 MG CAPS    Take by mouth daily.   MULTIVITAMIN (ONE-A-DAY MEN'S) TABS    Take 1 tablet by mouth daily. Take one tablet once a day   OMEGA-3 FATTY ACIDS (FISH OIL) 1200 MG CAPS    Take by mouth daily.   ZOSTER VACCINE LIVE, PF, (ZOSTAVAX) 10258 UNT/0.65ML INJECTION    Inject 19,400 Units into the skin once.  Modified Medications   No medications on file  Discontinued Medications   BUPROPION (WELLBUTRIN XL) 150  MG 24 HR TABLET    Take 1 tablet (150 mg total) by mouth daily.   PRAVASTATIN (PRAVACHOL) 80 MG TABLET    Take one tablet once daily   RED YEAST RICE EXTRACT (RED YEAST RICE PO)    Take 1,200 mg by mouth daily.   Physical Exam: Filed Vitals:   07/20/14 1530  BP: 126/70  Pulse: 80  Temp: 97.7 F (36.5 C)  TempSrc: Oral  Resp: 20  Height: 6\' 1"  (1.854 m)  Weight: 211 lb 6.4 oz (95.89 kg)  SpO2: 96%  Physical Exam  Constitutional: He is oriented to person, place, and time. He appears well-developed and well-nourished. No distress.  Cardiovascular: Normal rate, regular rhythm, normal heart sounds and intact distal pulses.   Pulmonary/Chest: Effort normal and breath sounds normal. No respiratory distress.  Abdominal: Soft. Bowel sounds are normal. He exhibits no distension and no mass. There is no tenderness.  Musculoskeletal: Normal range of motion. He exhibits no edema or tenderness.  Neurological: He is alert and oriented to person, place, and time.  Skin: Skin is warm and dry.  Psychiatric: He has a normal mood and affect.    Labs reviewed: Basic Metabolic Panel:  Recent Labs  03/20/14 1005  NA 140  K 4.0  CL 102  CO2 25  GLUCOSE 94  BUN 18  CREATININE 0.91  CALCIUM 10.2  TSH 0.813   Liver Function Tests:  Recent Labs  03/20/14 1005  AST 19  ALT 19  ALKPHOS 71  BILITOT 0.4  PROT 6.0   No results for input(s): LIPASE, AMYLASE in the last 8760 hours. No results for input(s): AMMONIA in the last 8760 hours. CBC:  Recent Labs  03/20/14 1005  WBC 8.2  NEUTROABS 4.2  HGB 16.5  HCT 48.1  MCV 92  PLT 221   Lipid Panel:  Recent Labs  03/20/14 1005  CHOL 188  HDL 54  LDLCALC 107*  TRIG 135  CHOLHDL 3.5   Lab Results  Component Value Date   HGBA1C 5.4 03/20/2014    Assessment/Plan 1. Essential hypertension, benign - bp at goal with current therapy--renewed Rx - amLODipine (NORVASC) 10 MG tablet; Take 1 tablet (10 mg total) by mouth daily.  Take one tablet once a day for blood pressure  Dispense: 90 tablet; Refill: 1  2. Screening for colon cancer - Ambulatory referral to Gastroenterology - agrees to routine screening cscope -previously had one for complaint about 10 years ago at [facility]  3. Hyperlipidemia - will see what his lipids look like--if LDL still high, will increase lipitor to 80mg   - atorvastatin (LIPITOR) 40 MG tablet;  Take one tablet by mouth once daily for cholesterol  Dispense: 90 tablet; Refill: 1 - Lipid panel  4. Tobacco abuse -cont to cut back on cigarettes  Labs/tests ordered:   Orders Placed This Encounter  Procedures  . Lipid panel    Order Specific Question:  Has the patient fasted?    Answer:  Yes  . Ambulatory referral to Gastroenterology    Referral Priority:  Routine    Referral Type:  Consultation    Referral Reason:  Specialty Services Required    Requested Specialty:  Gastroenterology    Number of Visits Requested:  1    Next appt:  3 mos--he will call for appt  Miko Markwood L. Elianys Conry, D.O. [facility] [address], [place] [address] Cell Phone (Mon-Fri 8am-5pm):  [phone] On Call:  [phone] & follow prompts after 5pm & weekends Office Phone:  [phone] Office Fax:  [phone]

## 2014-07-20 NOTE — Patient Instructions (Signed)
Continue to cut back on cigarettes!

## 2014-07-21 LAB — LIPID PANEL
Chol/HDL Ratio: 3.5 ratio units (ref 0.0–5.0)
Cholesterol, Total: 177 mg/dL (ref 100–199)
HDL: 50 mg/dL (ref >39)
LDL Calculated: 106 mg/dL — ABNORMAL HIGH (ref 0–99)
Triglycerides: 105 mg/dL (ref 0–149)
VLDL Cholesterol Cal: 21 mg/dL (ref 5–40)

## 2014-07-24 ENCOUNTER — Other Ambulatory Visit: Payer: Self-pay

## 2014-07-24 MED ORDER — ATORVASTATIN CALCIUM 80 MG PO TABS: 80.0000 mg | ORAL_TABLET | Freq: Every day | ORAL | Status: DC

## 2014-10-13 ENCOUNTER — Other Ambulatory Visit: Payer: 59

## 2014-10-13 DIAGNOSIS — E785 Hyperlipidemia, unspecified: Secondary | ICD-10-CM

## 2014-10-14 LAB — LIPID PANEL
Chol/HDL Ratio: 2.3 ratio units (ref 0.0–5.0)
Cholesterol, Total: 116 mg/dL (ref 100–199)
HDL: 50 mg/dL (ref >39)
LDL Calculated: 52 mg/dL (ref 0–99)
Triglycerides: 72 mg/dL (ref 0–149)
VLDL Cholesterol Cal: 14 mg/dL (ref 5–40)

## 2014-10-19 ENCOUNTER — Ambulatory Visit (INDEPENDENT_AMBULATORY_CARE_PROVIDER_SITE_OTHER): Payer: 59 | Admitting: Internal Medicine

## 2014-10-19 ENCOUNTER — Encounter: Payer: Self-pay | Admitting: Internal Medicine

## 2014-10-19 VITALS — BP 128/88 | HR 84 | Temp 98.1°F | Resp 18 | Ht 73.0 in | Wt 213.0 lb

## 2014-10-19 DIAGNOSIS — E669 Obesity, unspecified: Secondary | ICD-10-CM

## 2014-10-19 DIAGNOSIS — E785 Hyperlipidemia, unspecified: Secondary | ICD-10-CM | POA: Diagnosis not present

## 2014-10-19 DIAGNOSIS — J449 Chronic obstructive pulmonary disease, unspecified: Secondary | ICD-10-CM | POA: Diagnosis not present

## 2014-10-19 DIAGNOSIS — R350 Frequency of micturition: Secondary | ICD-10-CM | POA: Diagnosis not present

## 2014-10-19 DIAGNOSIS — Z720 Tobacco use: Secondary | ICD-10-CM | POA: Diagnosis not present

## 2014-10-19 DIAGNOSIS — I10 Essential (primary) hypertension: Secondary | ICD-10-CM | POA: Diagnosis not present

## 2014-10-19 MED ORDER — ATORVASTATIN CALCIUM 80 MG PO TABS: 80.0000 mg | ORAL_TABLET | Freq: Every day | ORAL | Status: DC

## 2014-10-19 MED ORDER — AMLODIPINE BESYLATE 10 MG PO TABS: 10.0000 mg | ORAL_TABLET | Freq: Every day | ORAL | Status: DC

## 2014-10-19 NOTE — Progress Notes (Signed)
Patient ID: Tom Lane, male   DOB: September 15, 1960, 54 y.o.   MRN: [id number]   Location:  [facility]  Code Status: full code Goals of Care: Advanced Directive information Does patient have an advance directive?: No  Chief Complaint  Patient presents with  . Medical Management of Chronic Issues    follow-up for medical management ,labs printed    HPI: Patient is a 54 y.o.  White male seen in the office today for med mgt of his chronic illnesses.  He feels fine and has no complaints.  He had his colonoscopy with Dr. Benson Norway which showed polyps and requires a repeat in 2019.  I don't have the report only the note saying one would be done.    He continues to smoke though <1/2 ppd b/c doesn't smoke if he's busy working in the yard.  He drives a truck for a living.  He does not want to try chantix due to cost.  Wellbutrin made him gain weight.    His cholesterol was discussed and has improved--discussed dietary changes today to help lower the dosage of medication needed.  BP is down today with amlodipine and no smoking just before visit.    Review of Systems:  Review of Systems  Constitutional: Negative for fever and chills.  HENT: Negative for congestion.   Eyes: Negative for blurred vision.  Respiratory: Negative for shortness of breath.   Cardiovascular: Negative for chest pain and leg swelling.  Gastrointestinal: Negative for abdominal pain.  Genitourinary: Positive for frequency. Negative for dysuria and urgency.       Increased frequency at night but admits he probably drinks fluids too close to bedtime  Musculoskeletal: Negative for myalgias and falls.  Skin: Negative for rash.  Neurological: Negative for dizziness.  Psychiatric/Behavioral: Negative for memory loss.    Past Medical History  Diagnosis Date  . Pain in limb   . Other abnormal blood chemistry   . Overweight(278.02)   . Unspecified disorder of skin and subcutaneous tissue     . Chronic airway obstruction, not elsewhere classified   . Dermatophytosis of the body   . Wheezing   . Obesity, unspecified   . Other and unspecified hyperlipidemia   . Tobacco use disorder   . Unspecified essential hypertension   . Depressive disorder, not elsewhere classified   . Psychosexual dysfunction with inhibited sexual excitement   . Other malaise and fatigue   . Chest pain, unspecified   . Dyspepsia and other specified disorders of function of stomach     Past Surgical History  Procedure Laterality Date  . Right renal stone removed  1983    Dr Serita Butcher  . Anal fissure repair  1985    Allergies  Allergen Reactions  . Codeine    Medications: Patient's Medications  New Prescriptions   No medications on file  Previous Medications   ASPIRIN 81 MG TABLET    Take 81 mg by mouth daily. Take one tablet once a day   GARLIC 0962 MG CAPS    Take by mouth daily.   MULTIVITAMIN (ONE-A-DAY MEN'S) TABS    Take 1 tablet by mouth daily. Take one tablet once a day   OMEGA-3 FATTY ACIDS (FISH OIL) 1200 MG CAPS    Take by mouth daily.   ZOSTER VACCINE LIVE, PF, (ZOSTAVAX) 83662 UNT/0.65ML INJECTION    Inject 19,400 Units into the skin once.  Modified Medications   Modified Medication Previous Medication  AMLODIPINE (NORVASC) 10 MG TABLET amLODipine (NORVASC) 10 MG tablet      Take 1 tablet (10 mg total) by mouth daily. Take one tablet once a day for blood pressure    Take 1 tablet (10 mg total) by mouth daily. Take one tablet once a day for blood pressure   ATORVASTATIN (LIPITOR) 80 MG TABLET atorvastatin (LIPITOR) 80 MG tablet      Take 1 tablet (80 mg total) by mouth at bedtime. For High Cholesterol    Take 1 tablet (80 mg total) by mouth at bedtime. For High Cholesterol  Discontinued Medications   No medications on file    Physical Exam: Filed Vitals:   10/19/14 1015  BP: 128/88  Pulse: 84  Temp: 98.1 F (36.7 C)  TempSrc: Oral  Resp: 18  Height: 6\' 1"  (1.854 m)   Weight: 213 lb (96.616 kg)  SpO2: 95%   Physical Exam  Constitutional: He is oriented to person, place, and time. He appears well-developed and well-nourished.  Eyes:  glasses  Cardiovascular: Normal rate, regular rhythm, normal heart sounds and intact distal pulses.   Pulmonary/Chest: Effort normal and breath sounds normal. No respiratory distress. He has no wheezes.  Abdominal: Bowel sounds are normal.  Musculoskeletal: Normal range of motion. He exhibits no edema or tenderness.  Neurological: He is alert and oriented to person, place, and time.  Skin: Skin is warm and dry.    Labs reviewed: Basic Metabolic Panel:  Recent Labs  03/20/14 1005  NA 140  K 4.0  CL 102  CO2 25  GLUCOSE 94  BUN 18  CREATININE 0.91  CALCIUM 10.2  TSH 0.813   Liver Function Tests:  Recent Labs  03/20/14 1005  AST 19  ALT 19  ALKPHOS 71  BILITOT 0.4  PROT 6.0   No results for input(s): LIPASE, AMYLASE in the last 8760 hours. No results for input(s): AMMONIA in the last 8760 hours. CBC:  Recent Labs  03/20/14 1005  WBC 8.2  NEUTROABS 4.2  HGB 16.5  HCT 48.1  MCV 92  PLT 221   Lipid Panel:  Recent Labs  03/20/14 1005 07/20/14 1624 10/13/14 0912  CHOL 188 177 116  HDL 54 50 50  LDLCALC 107* 106* 52  TRIG 135 105 72  CHOLHDL 3.5 3.5 2.3   Lab Results  Component Value Date   HGBA1C 5.4 03/20/2014    Procedures since last visit: Had colonoscopy, Dr. Benson Norway:  Had 3 polyps.  Needs next one in 3 years.  Assessment/Plan 1. 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 Take one tablet once a day for blood pressure  Dispense: 90 tablet; Refill: 1  2. Hyperlipidemia -lipids finally at goal with lipitor max dose -counseled on dietary changes to help with this so he can take a lower dose in time--cut back on fried foods  which are the culprit for him - atorvastatin (LIPITOR) 80 MG tablet; Take 1 tablet (80 mg total) by mouth at bedtime. For High Cholesterol  Dispense: 90 tablet; Refill: 1  3. Tobacco abuse -smoking is the same -wellbutrin was helpful, but made him too hungry--ate straight for a week -smoking less than a 1/2 ppd - not ready for chantix due to cost -was again counseled on this  4. 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 Chronic obstructive pulmonary disease, unspecified COPD, unspecified chronic bronchitis type -suspected, will consider pulmonary referral if he develops more symptoms  6.  Urinary frequency -counseled to reduce drinks for at least 2 hours before bed -will also check psa with next labs  Labs/tests ordered:  No new--will do PSA and others day of his appt--advised to come fasting  Next appt:  6 mos  Tom Lane, D.O. [facility] [address], [place] [address] Cell Phone (Mon-Fri 8am-5pm):  [phone] On Call:  [phone] & follow prompts after 5pm & weekends Office Phone:  [phone] Office Fax:  [phone]

## 2015-02-05 ENCOUNTER — Other Ambulatory Visit: Payer: Self-pay | Admitting: Internal Medicine

## 2015-04-27 ENCOUNTER — Ambulatory Visit: Payer: 59 | Admitting: Internal Medicine

## 2015-07-02 ENCOUNTER — Ambulatory Visit: Payer: 59 | Admitting: Internal Medicine

## 2015-07-30 ENCOUNTER — Other Ambulatory Visit: Payer: Self-pay | Admitting: Internal Medicine

## 2015-07-31 ENCOUNTER — Other Ambulatory Visit: Payer: Self-pay | Admitting: Internal Medicine

## 2015-08-06 ENCOUNTER — Ambulatory Visit: Payer: 59 | Admitting: Internal Medicine

## 2015-10-15 ENCOUNTER — Ambulatory Visit: Payer: 59 | Admitting: Internal Medicine

## 2015-11-05 ENCOUNTER — Ambulatory Visit (INDEPENDENT_AMBULATORY_CARE_PROVIDER_SITE_OTHER): Payer: 59 | Admitting: Internal Medicine

## 2015-11-05 ENCOUNTER — Encounter: Payer: Self-pay | Admitting: Internal Medicine

## 2015-11-05 VITALS — BP 128/70 | HR 88 | Temp 98.3°F | Ht 73.0 in | Wt 206.0 lb

## 2015-11-05 DIAGNOSIS — Z87442 Personal history of urinary calculi: Secondary | ICD-10-CM

## 2015-11-05 DIAGNOSIS — R3911 Hesitancy of micturition: Secondary | ICD-10-CM | POA: Insufficient documentation

## 2015-11-05 DIAGNOSIS — E669 Obesity, unspecified: Secondary | ICD-10-CM

## 2015-11-05 DIAGNOSIS — E785 Hyperlipidemia, unspecified: Secondary | ICD-10-CM

## 2015-11-05 DIAGNOSIS — J449 Chronic obstructive pulmonary disease, unspecified: Secondary | ICD-10-CM

## 2015-11-05 DIAGNOSIS — I10 Essential (primary) hypertension: Secondary | ICD-10-CM | POA: Diagnosis not present

## 2015-11-05 DIAGNOSIS — F17200 Nicotine dependence, unspecified, uncomplicated: Secondary | ICD-10-CM | POA: Diagnosis not present

## 2015-11-05 LAB — CBC WITH DIFFERENTIAL/PLATELET
Basophils Absolute: 0 cells/uL (ref 0–200)
Basophils Relative: 0 %
Eosinophils Absolute: 308 cells/uL (ref 15–500)
Eosinophils Relative: 4 %
HCT: 47.8 % (ref 38.5–50.0)
Hemoglobin: 16.5 g/dL (ref 13.2–17.1)
Lymphocytes Relative: 32 %
Lymphs Abs: 2464 cells/uL (ref 850–3900)
MCH: 30.9 pg (ref 27.0–33.0)
MCHC: 34.5 g/dL (ref 32.0–36.0)
MCV: 89.5 fL (ref 80.0–100.0)
MPV: 9.5 fL (ref 7.5–12.5)
Monocytes Absolute: 616 cells/uL (ref 200–950)
Monocytes Relative: 8 %
Neutro Abs: 4312 cells/uL (ref 1500–7800)
Neutrophils Relative %: 56 %
Platelets: 198 10*3/uL (ref 140–400)
RBC: 5.34 MIL/uL (ref 4.20–5.80)
RDW: 14.6 % (ref 11.0–15.0)
WBC: 7.7 10*3/uL (ref 3.8–10.8)

## 2015-11-05 LAB — COMPREHENSIVE METABOLIC PANEL
ALT: 22 U/L (ref 9–46)
AST: 21 U/L (ref 10–35)
Albumin: 4 g/dL (ref 3.6–5.1)
Alkaline Phosphatase: 84 U/L (ref 40–115)
BUN: 24 mg/dL (ref 7–25)
CO2: 23 mmol/L (ref 20–31)
Calcium: 9.4 mg/dL (ref 8.6–10.3)
Chloride: 107 mmol/L (ref 98–110)
Creat: 1.09 mg/dL (ref 0.70–1.33)
Glucose, Bld: 88 mg/dL (ref 65–99)
Potassium: 4.4 mmol/L (ref 3.5–5.3)
Sodium: 139 mmol/L (ref 135–146)
Total Bilirubin: 0.5 mg/dL (ref 0.2–1.2)
Total Protein: 5.8 g/dL — ABNORMAL LOW (ref 6.1–8.1)

## 2015-11-05 LAB — LIPID PANEL
Cholesterol: 128 mg/dL (ref 125–200)
HDL: 51 mg/dL (ref >=40)
LDL Cholesterol: 64 mg/dL (ref <130)
Total CHOL/HDL Ratio: 2.5 Ratio (ref <=5.0)
Triglycerides: 67 mg/dL (ref <150)
VLDL: 13 mg/dL (ref <30)

## 2015-11-05 MED ORDER — ATORVASTATIN CALCIUM 80 MG PO TABS: 80.0000 mg | ORAL_TABLET | Freq: Every day | ORAL | 3 refills | Status: DC

## 2015-11-05 MED ORDER — ZOSTER VACCINE LIVE 19400 UNT/0.65ML ~~LOC~~ SUSR: 0.6500 mL | Freq: Once | SUBCUTANEOUS | 0 refills | Status: AC

## 2015-11-05 MED ORDER — AMLODIPINE BESYLATE 10 MG PO TABS: 10.0000 mg | ORAL_TABLET | Freq: Every day | ORAL | 3 refills | Status: DC

## 2015-11-05 NOTE — Progress Notes (Signed)
Location:  [facility] Provider:  Vlad Mayberry L. Mariea Clonts, D.O., C.M.D.  Code Status: full code Goals of Care:  Advanced Directives 11/05/2015  Does patient have an advance directive? No  Would patient like information on creating an advanced directive? No - patient declined information   Chief Complaint  Patient presents with  . Medical Management of Chronic Issues    medication refills, difficulty urinating    HPI: Patient is a 55 y.o. male seen today for medical management of chronic diseases.    Having difficulty urinating.  Sometimes it's like [place] and other times, he cannot go--will barely come out.  Getting up 2-3 times per night now when it used to be 1-2 like it used to be.    Doesn't get home until  2 hrs before bed to hold off on fluids so drinking just before bed.  At first, he did have some discomfort, but then it went away.  He did see very dark urine a few times, too, and he has had kidneys stones in the past.    Agrees to get zostavax now.    Smoking:  Still smoking, but less than he used to.    Hyperlipidemia:  We need to recheck the cholesterol  HTN:  bp at goal today.  Has lost 7 lbs in the past week.  Had been up to 220 at one point, he reports. Is mowing the yard and working outside when he gets home.    His job has changed and his insurance.  Now he's off Sunday and Monday weekly.    Past Medical History:  Diagnosis Date  . Chest pain, unspecified   . Chronic airway obstruction, not elsewhere classified   . Depressive disorder, not elsewhere classified   . Dermatophytosis of the body   . Dyspepsia and other specified disorders of function of stomach   . Obesity, unspecified   . Other abnormal blood chemistry   . Other and unspecified hyperlipidemia   . Other malaise and fatigue   . Overweight(278.02)   . Pain in limb   . Psychosexual dysfunction with inhibited sexual excitement   . Tobacco use disorder   . Unspecified disorder of skin and  subcutaneous tissue   . Unspecified essential hypertension   . Wheezing     Past Surgical History:  Procedure Laterality Date  . ANAL FISSURE REPAIR  1985  . right renal stone removed  1983   Dr Serita Butcher    Allergies  Allergen Reactions  . Codeine       Medication List       Accurate as of 11/05/15  1:47 PM. Always use your most recent med list.          amLODipine 10 MG tablet Commonly known as:  NORVASC Take 1 tablet (10 mg total) by mouth daily. Blood pressure   aspirin 81 MG tablet Take 81 mg by mouth daily. Take one tablet once a day   atorvastatin 80 MG tablet Commonly known as:  LIPITOR Take 1 tablet (80 mg total) by mouth daily at 6 PM. cholesterol   Fish Oil 1200 MG Caps Take by mouth daily.   Garlic 123XX123 MG Caps Take by mouth daily.   multivitamin Tabs tablet Take 1 tablet by mouth daily. Take one tablet once a day   Zoster Vaccine Live (PF) 19400 UNT/0.65ML injection Commonly known as:  ZOSTAVAX Inject 19,400 Units into the skin once.       Review of Systems:  Review of Systems  Constitutional: Positive for malaise/fatigue and weight loss. Negative for chills and fever.  HENT: Negative for congestion and hearing loss.   Eyes: Negative for blurred vision.       Glasses  Respiratory: Negative for cough and shortness of breath.   Cardiovascular: Positive for leg swelling. Negative for chest pain and palpitations.       Notes some days legs swell (sodium intake discussed)  Gastrointestinal: Negative for abdominal pain, blood in stool, constipation, diarrhea, melena, nausea and vomiting.  Genitourinary: Positive for frequency and hematuria. Negative for dysuria, flank pain and urgency.       Difficulty starting stream at times  Musculoskeletal: Negative for back pain, falls and joint pain.  Skin: Negative for itching and rash.  Neurological: Negative for dizziness, loss of consciousness, weakness and headaches.  Psychiatric/Behavioral:  Negative for depression and memory loss. The patient is not nervous/anxious and does not have insomnia.     Health Maintenance  Topic Date Due  . Hepatitis C Screening  [birth year]  . HIV Screening  09/18/1975  . INFLUENZA VACCINE  11/13/2015  . TETANUS/TDAP  04/15/2019  . COLONOSCOPY  09/06/2024    Physical Exam: Vitals:   11/05/15 1026  BP: 128/70  Pulse: 88  Temp: 98.3 F (36.8 C)  TempSrc: Oral  SpO2: 97%  Weight: 206 lb (93.4 kg)  Height: 6\' 1"  (1.854 m)   Body mass index is 27.18 kg/m. Physical Exam  Constitutional: He is oriented to person, place, and time. He appears well-developed and well-nourished. No distress.  HENT:  Head: Normocephalic and atraumatic.  Eyes: EOM are normal. Pupils are equal, round, and reactive to light.  glasses  Cardiovascular: Normal rate and intact distal pulses.   Murmur heard. Pulmonary/Chest: Effort normal and breath sounds normal. No respiratory distress.  Abdominal: Soft. Bowel sounds are normal. He exhibits no distension.  Musculoskeletal: Normal range of motion. He exhibits no edema.  Neurological: He is alert and oriented to person, place, and time.  Skin: Skin is warm and dry. Capillary refill takes less than 2 seconds.  Psychiatric: He has a normal mood and affect.    Labs reviewed: Basic Metabolic Panel: No results for input(s): NA, K, CL, CO2, GLUCOSE, BUN, CREATININE, CALCIUM, MG, PHOS, TSH in the last 8760 hours. Liver Function Tests: No results for input(s): AST, ALT, ALKPHOS, BILITOT, PROT, ALBUMIN in the last 8760 hours. No results for input(s): LIPASE, AMYLASE in the last 8760 hours. No results for input(s): AMMONIA in the last 8760 hours. CBC: No results for input(s): WBC, NEUTROABS, HGB, HCT, MCV, PLT in the last 8760 hours. Lipid Panel: No results for input(s): CHOL, HDL, LDLCALC, TRIG, CHOLHDL, LDLDIRECT in the last 8760 hours. Lab Results  Component Value Date   HGBA1C 5.4 03/20/2014     Assessment/Plan 1. Essential hypertension, benign - bp at goal with amlodipine (notes edema, but not daily so doubt med effect) -discussed watching sodium intake in his foods (prepared foods) - CBC with Differential/Platelet - Comprehensive metabolic panel  2. Chronic obstructive pulmonary disease, unspecified COPD type ([place]) -cont to monitor and reduced cigarettes  3. Obesity - has lost some weight since switching jobs and being about to do more work outside when he gets home and watching his diet better -seems a friend's illness has motivated him also - Hemoglobin A1c - Lipid panel  4. Hyperlipidemia -cont lipitor 80mg  -previously I had advised he should not take the red rice yeast with the lipitor b/c they  could cause cramping in combination or affect his liver (he does not recall this and has been taking both) - Lipid panel  5. Tobacco use disorder -cont to work on cutting back on cigarettes  6. Urinary hesitancy - has been becoming more problematic recently, plus, he has a friend who has to have his prostate removed for prostate cancer so he is concerned more about his now - PSA - Urinalysis with Reflex Microscopic  7. History of nephrolithiasis -has some hematuria a few months ago (gross) when he was having some dysuria, but that resolved since -will still send urine for micro to evaluate for signs of stones - PSA - Urinalysis with Reflex Microscopic  Labs/tests ordered:   Orders Placed This Encounter  Procedures  . PSA  . Urinalysis with Reflex Microscopic  . CBC with Differential/Platelet  . Comprehensive metabolic panel    Order Specific Question:   Has the patient fasted?    Answer:   Yes  . Hemoglobin A1c  . Lipid panel    Order Specific Question:   Has the patient fasted?    Answer:   Yes   Next appt:  03/03/2016 for annual   Stuckey. Demani Mcbrien, D.O. [facility] [address], [place]  [address] Cell Phone (Mon-Fri 8am-5pm):  [phone] On Call:  [phone] & follow prompts after 5pm & weekends Office Phone:  [phone] Office Fax:  [phone]

## 2015-11-06 LAB — URINALYSIS, MICROSCOPIC ONLY
Bacteria, UA: NONE SEEN [HPF]
Casts: NONE SEEN [LPF]
Crystals: NONE SEEN [HPF]
RBC / HPF: NONE SEEN RBC/HPF (ref <=2)
Squamous Epithelial / LPF: NONE SEEN [HPF] (ref <=5)
Yeast: NONE SEEN [HPF]

## 2015-11-06 LAB — URINALYSIS, ROUTINE W REFLEX MICROSCOPIC
Bilirubin Urine: NEGATIVE
Glucose, UA: NEGATIVE
Ketones, ur: NEGATIVE
Nitrite: NEGATIVE
Protein, ur: NEGATIVE
Specific Gravity, Urine: 1.013 (ref 1.001–1.035)
pH: 5.5 (ref 5.0–8.0)

## 2015-11-06 LAB — HEMOGLOBIN A1C
Hgb A1c MFr Bld: 5.6 % (ref <5.7)
Mean Plasma Glucose: 114 mg/dL

## 2015-11-06 LAB — PSA: PSA: 0.92 ng/mL (ref <=4.00)

## 2015-11-07 ENCOUNTER — Encounter: Payer: Self-pay | Admitting: *Deleted

## 2016-03-03 ENCOUNTER — Ambulatory Visit (INDEPENDENT_AMBULATORY_CARE_PROVIDER_SITE_OTHER): Payer: Commercial Managed Care - HMO | Admitting: Internal Medicine

## 2016-03-03 ENCOUNTER — Encounter: Payer: Self-pay | Admitting: Internal Medicine

## 2016-03-03 VITALS — BP 130/70 | HR 76 | Temp 98.1°F | Ht 73.0 in | Wt 208.0 lb

## 2016-03-03 DIAGNOSIS — Z23 Encounter for immunization: Secondary | ICD-10-CM

## 2016-03-03 DIAGNOSIS — I10 Essential (primary) hypertension: Secondary | ICD-10-CM

## 2016-03-03 DIAGNOSIS — J449 Chronic obstructive pulmonary disease, unspecified: Secondary | ICD-10-CM

## 2016-03-03 DIAGNOSIS — R310 Gross hematuria: Secondary | ICD-10-CM | POA: Diagnosis not present

## 2016-03-03 DIAGNOSIS — Z0000 Encounter for general adult medical examination without abnormal findings: Secondary | ICD-10-CM | POA: Diagnosis not present

## 2016-03-03 DIAGNOSIS — Z87442 Personal history of urinary calculi: Secondary | ICD-10-CM

## 2016-03-03 NOTE — Progress Notes (Signed)
Provider:  Rexene Edison. Mariea Clonts, D.O., C.M.D. Location:    [facility] of Service:   clinic  Previous PCP: Hollace Kinnier, DO Patient Care Team: Gayland Curry, DO as PCP - General (Geriatric Medicine) Carol Ada, MD as Consulting Physician (Gastroenterology)  Extended Emergency Contact Information Primary Emergency Contact: Karle Plumber Address: [address]          [place] [address] Johnnette Litter of [place] Phone: [phone] Relation: Spouse  Code Status:full code Goals of Care: Advanced Directive information Advanced Directives 11/05/2015  Does patient have an advance directive? No  Would patient like information on creating an advanced directive? No - patient declined information   Chief Complaint  Patient presents with  . Annual Exam    physical exam    HPI: Patient is a 55 y.o. male seen today for an annual physical exam.  Agreed to flu shot with some convincing.    BP was good today.  Even had been drinking coffee all morning and had a cigarette before coming.    No longer smoking a pack per day.  As long as he stays busy, he is fine.  He had a lot of blood in his urine last week.  He's had no pain with it.  Has had kidney stones before, but typically knows when that is going on.  Had burning last week.  Water was bright red.  He had been mowing the grass the day before.  Last time was early in the summer.  Had urgency at the time.  It gradually got lighter and now has quit.  DOT physical did show blood in his urine.    COPD:  Was on medication.  Can run a 1/2 mile before he gets winded.  He does run or walk to his truck.  Does not cough much.  Will have some phlegm.    Hyperlipidemia in July was improved.  Says he will eat wrong at the holidays.    Past Medical History:  Diagnosis Date  . Chest pain, unspecified   . Chronic airway obstruction, not elsewhere classified   . Depressive disorder, not elsewhere classified   . Dermatophytosis of the body    . Dyspepsia and other specified disorders of function of stomach   . Obesity, unspecified   . Other abnormal blood chemistry   . Other and unspecified hyperlipidemia   . Other malaise and fatigue   . Overweight(278.02)   . Pain in limb   . Psychosexual dysfunction with inhibited sexual excitement   . Tobacco use disorder   . Unspecified disorder of skin and subcutaneous tissue   . Unspecified essential hypertension   . Wheezing    Past Surgical History:  Procedure Laterality Date  . ANAL FISSURE REPAIR  1985  . right renal stone removed  1983   Dr Serita Butcher    reports that he has been smoking Cigarettes.  He has a 40.00 pack-year smoking history. He has never used smokeless tobacco. He reports that he drinks alcohol. He reports that he does not use drugs.  Functional Status Survey:    Family History  Problem Relation Age of Onset  . Diabetes Father   . Stroke Father   . Stroke Sister   . Diabetes Sister     Health Maintenance  Topic Date Due  . Hepatitis C Screening  [birth year]  . HIV Screening  09/18/1975  . TETANUS/TDAP  04/15/2019  . COLONOSCOPY  09/06/2024  . INFLUENZA VACCINE  Completed  Allergies  Allergen Reactions  . Codeine       Medication List       Accurate as of 03/03/16  2:06 PM. Always use your most recent med list.          amLODipine 10 MG tablet Commonly known as:  NORVASC Take 1 tablet (10 mg total) by mouth daily. Blood pressure   aspirin 81 MG tablet Take 81 mg by mouth daily. Take one tablet once a day   atorvastatin 80 MG tablet Commonly known as:  LIPITOR Take 1 tablet (80 mg total) by mouth daily at 6 PM. cholesterol   Fish Oil 1200 MG Caps Take by mouth daily.   Garlic 123XX123 MG Caps Take by mouth daily.   multivitamin Tabs tablet Take 1 tablet by mouth daily. Take one tablet once a day       Review of Systems  Constitutional: Negative for chills, fever, malaise/fatigue and weight loss.  HENT: Negative for  congestion and hearing loss.   Eyes: Negative for blurred vision.  Respiratory: Positive for cough. Negative for shortness of breath.   Cardiovascular: Negative for chest pain, palpitations and leg swelling.  Gastrointestinal: Negative for abdominal pain, blood in stool, constipation and melena.  Genitourinary: Positive for frequency and hematuria. Negative for dysuria and urgency.  Musculoskeletal: Negative for back pain, falls and joint pain.  Skin: Negative for itching and rash.  Neurological: Negative for dizziness, loss of consciousness and weakness.  Endo/Heme/Allergies: Does not bruise/bleed easily.  Psychiatric/Behavioral: Negative for depression and memory loss.    Vitals:   03/03/16 1342  BP: 130/70  Pulse: 76  Temp: 98.1 F (36.7 C)  TempSrc: Oral  SpO2: 97%  Weight: 208 lb (94.3 kg)  Height: 6\' 1"  (1.854 m)   Body mass index is 27.44 kg/m. Physical Exam  Constitutional: He is oriented to person, place, and time. He appears well-developed and well-nourished. No distress.  HENT:  Head: Normocephalic and atraumatic.  Right Ear: External ear normal.  Left Ear: External ear normal.  Nose: Nose normal.  Mouth/Throat: Oropharynx is clear and moist.  Small amt of cerumen in left ear  Eyes: Conjunctivae and EOM are normal. Pupils are equal, round, and reactive to light.  glasses  Neck: Neck supple. No JVD present.  Cardiovascular: Normal rate, regular rhythm, normal heart sounds and intact distal pulses.   Pulmonary/Chest: Effort normal and breath sounds normal. No respiratory distress.  Abdominal: Soft. Bowel sounds are normal. He exhibits no distension and no mass. There is no tenderness. There is no guarding.  Genitourinary:  Genitourinary Comments: No flank pain  Musculoskeletal: Normal range of motion.  Lymphadenopathy:    He has no cervical adenopathy.  Neurological: He is alert and oriented to person, place, and time. No cranial nerve deficit.  Skin: Skin is  warm and dry. Capillary refill takes less than 2 seconds.  Psychiatric: He has a normal mood and affect.    Labs reviewed: Basic Metabolic Panel:  Recent Labs  11/05/15 1143  NA 139  K 4.4  CL 107  CO2 23  GLUCOSE 88  BUN 24  CREATININE 1.09  CALCIUM 9.4   Liver Function Tests:  Recent Labs  11/05/15 1143  AST 21  ALT 22  ALKPHOS 84  BILITOT 0.5  PROT 5.8*  ALBUMIN 4.0   No results for input(s): LIPASE, AMYLASE in the last 8760 hours. No results for input(s): AMMONIA in the last 8760 hours. CBC:  Recent Labs  11/05/15 1143  WBC 7.7  NEUTROABS 4,312  HGB 16.5  HCT 47.8  MCV 89.5  PLT 198   Cardiac Enzymes: No results for input(s): CKTOTAL, CKMB, CKMBINDEX, TROPONINI in the last 8760 hours. BNP: Invalid input(s): POCBNP Lab Results  Component Value Date   HGBA1C 5.6 11/05/2015   Lab Results  Component Value Date   TSH 0.813 03/20/2014   Lab Results  Component Value Date   VITAMINB12 930 08/02/2012    Assessment/Plan 1. Annual physical exam - completed today and flu shot given, refuses pfts for now, needs pneumovax next time - Lipid panel; Future - CBC with Differential/Platelet; Future - COMPLETE METABOLIC PANEL WITH GFR; Future  2. Gross hematuria - new concern today that requires referral - Ambulatory referral to Urology--due to recurrent difficulty with hematuria now and being a smoker, needs eval - also has had kidney stones in the past but now w/o pain at all and has not notably passed any - CBC with Differential/Platelet; Future  3. History of nephrolithiasis - Ambulatory referral to Urology - CBC with Differential/Platelet; Future  4. Chronic obstructive pulmonary disease, unspecified COPD type ([place]) -should get PFTs, but does not feel like he's having a problem here--will prioritize this at a future visit  5. Essential hypertension, benign -bp well controlled with norvasc only - Lipid panel; Future - COMPLETE METABOLIC PANEL  WITH GFR; Future  6. Need for immunization against influenza - Flu Vaccine QUAD 36+ mos PF IM (Fluarix & Fluzone Quad PF)  Labs/tests ordered:   Orders Placed This Encounter  Procedures  . Flu Vaccine QUAD 36+ mos PF IM (Fluarix & Fluzone Quad PF)  . Lipid panel    Standing Status:   Future    Standing Expiration Date:   10/31/2016    Order Specific Question:   Has the patient fasted?    Answer:   Yes  . CBC with Differential/Platelet    Standing Status:   Future    Standing Expiration Date:   10/31/2016  . COMPLETE METABOLIC PANEL WITH GFR    [facility]    Standing Status:   Future    Standing Expiration Date:   10/31/2016  . Ambulatory referral to Urology    Referral Priority:   Routine    Referral Type:   Consultation    Referral Reason:   Specialty Services Required    Requested Specialty:   Urology    Number of Visits Requested:   [place]. Jona Zappone, D.O. [facility] [address], [place] [address] Cell Phone (Mon-Fri 8am-5pm):  [phone] On Call:  [phone] & follow prompts after 5pm & weekends Office Phone:  [phone] Office Fax:  [phone]

## 2016-03-31 ENCOUNTER — Other Ambulatory Visit: Payer: Self-pay

## 2016-03-31 MED ORDER — ZOSTER VACCINE LIVE 19400 UNT/0.65ML ~~LOC~~ SUSR: 0.6500 mL | Freq: Once | SUBCUTANEOUS | 0 refills | Status: AC

## 2016-04-18 ENCOUNTER — Other Ambulatory Visit: Payer: Self-pay | Admitting: Urology

## 2016-04-18 NOTE — Progress Notes (Signed)
Scheduling pre op-  Please PLACE SURGICAL ORDERS IN EPIC  THANKS

## 2016-04-22 ENCOUNTER — Other Ambulatory Visit (HOSPITAL_COMMUNITY): Payer: Commercial Managed Care - HMO

## 2016-04-22 NOTE — Patient Instructions (Addendum)
Tom Lane  04/22/2016   Your procedure is scheduled on: Thursday 04/24/2016  Report to [facility]  Entrance take [facility]  elevators to 3rd floor to  [place] at  145  PM.  Call this number if you have problems the morning of surgery [phone]   Remember: ONLY 1 PERSON MAY GO WITH YOU TO SHORT STAY TO GET  READY MORNING OF [place].   Do not eat food  :After Midnight.MAY HAVE CLEAR LIQUIDS FROM MIDNIGHT UP UNTIL 0945 AM THEN NOTHING UNTIL AFTER SURGERY!     CLEAR LIQUID DIET   Foods Allowed                                                                     Foods Excluded  Coffee and tea, regular and decaf                             liquids that you cannot  Plain Jell-O in any flavor                                             see through such as: Fruit ices (not with fruit pulp)                                     milk, soups, orange juice  Iced Popsicles                                    All solid food Carbonated beverages, regular and diet                                    Cranberry, grape and apple juices Sports drinks like Gatorade Lightly seasoned clear broth or consume(fat free) Sugar, honey syrup  Sample Menu Breakfast                                Lunch                                     Supper Cranberry juice                    Beef broth                            Chicken broth Jell-O                                     Grape juice  Apple juice Coffee or tea                        Jell-O                                      Popsicle                                                Coffee or tea                        Coffee or tea  _____________________________________________________________________     Take these medicines the morning of surgery with A SIP OF WATER: Amlodipine                                You may not have any metal on your body including hair pins and               piercings  Do not wear jewelry, make-up, lotions, powders or perfumes, deodorant             Do not wear nail polish.  Do not shave  48 hours prior to surgery.              Men may shave face and neck.   Do not bring valuables to the hospital. [place].  Contacts, dentures or bridgework may not be worn into surgery.  Leave suitcase in the car. After surgery it may be brought to your room.     Patients discharged the day of surgery will not be allowed to drive home.  Name and phone number of your driver:spouse- [name]  Special Instructions: N/A              Please read over the following fact sheets you were given: _____________________________________________________________________             [facility] - Preparing for Surgery Before surgery, you can play an important role.  Because skin is not sterile, your skin needs to be as free of germs as possible.  You can reduce the number of germs on your skin by washing with CHG (chlorahexidine gluconate) soap before surgery.  CHG is an antiseptic cleaner which kills germs and bonds with the skin to continue killing germs even after washing. Please DO NOT use if you have an allergy to CHG or antibacterial soaps.  If your skin becomes reddened/irritated stop using the CHG and inform your nurse when you arrive at Short Stay. Do not shave (including legs and underarms) for at least 48 hours prior to the first CHG shower.  You may shave your face/neck. Please follow these instructions carefully:  1.  Shower with CHG Soap the night before surgery and the  morning of Surgery.  2.  If you choose to wash your hair, wash your hair first as usual with your  normal  shampoo.  3.  After you shampoo, rinse your hair and body thoroughly to remove the  shampoo.  4.  Use CHG as you would any other liquid soap.  You can apply chg directly  to the skin and wash                        Gently with a scrungie or clean washcloth.  5.  Apply the CHG Soap to your body ONLY FROM THE NECK DOWN.   Do not use on face/ open                           Wound or open sores. Avoid contact with eyes, ears mouth and genitals (private parts).                       Wash face,  Genitals (private parts) with your normal soap.             6.  Wash thoroughly, paying special attention to the area where your surgery  will be performed.  7.  Thoroughly rinse your body with warm water from the neck down.  8.  DO NOT shower/wash with your normal soap after using and rinsing off  the CHG Soap.                9.  Pat yourself dry with a clean towel.            10.  Wear clean pajamas.            11.  Place clean sheets on your bed the night of your first shower and do not  sleep with pets. Day of Surgery : Do not apply any lotions/deodorants the morning of surgery.  Please wear clean clothes to the hospital/surgery center.  FAILURE TO FOLLOW THESE INSTRUCTIONS MAY RESULT IN THE CANCELLATION OF YOUR SURGERY PATIENT SIGNATURE_________________________________  NURSE SIGNATURE__________________________________  ________________________________________________________________________

## 2016-04-23 ENCOUNTER — Encounter (HOSPITAL_COMMUNITY): Payer: Self-pay

## 2016-04-23 ENCOUNTER — Encounter (HOSPITAL_COMMUNITY)
Admission: RE | Admit: 2016-04-23 | Discharge: 2016-04-23 | Disposition: A | Discharge status: Home / Self Care | Payer: Commercial Managed Care - HMO | Source: Ambulatory Visit | Attending: Urology | Admitting: Urology

## 2016-04-23 DIAGNOSIS — Z87442 Personal history of urinary calculi: Secondary | ICD-10-CM | POA: Diagnosis not present

## 2016-04-23 DIAGNOSIS — F17210 Nicotine dependence, cigarettes, uncomplicated: Secondary | ICD-10-CM | POA: Diagnosis not present

## 2016-04-23 DIAGNOSIS — Z6829 Body mass index (BMI) 29.0-29.9, adult: Secondary | ICD-10-CM | POA: Diagnosis not present

## 2016-04-23 DIAGNOSIS — F329 Major depressive disorder, single episode, unspecified: Secondary | ICD-10-CM | POA: Diagnosis not present

## 2016-04-23 DIAGNOSIS — E785 Hyperlipidemia, unspecified: Secondary | ICD-10-CM | POA: Diagnosis not present

## 2016-04-23 DIAGNOSIS — Z885 Allergy status to narcotic agent status: Secondary | ICD-10-CM | POA: Diagnosis not present

## 2016-04-23 DIAGNOSIS — E669 Obesity, unspecified: Secondary | ICD-10-CM | POA: Diagnosis not present

## 2016-04-23 DIAGNOSIS — I10 Essential (primary) hypertension: Secondary | ICD-10-CM | POA: Diagnosis not present

## 2016-04-23 DIAGNOSIS — N132 Hydronephrosis with renal and ureteral calculous obstruction: Secondary | ICD-10-CM | POA: Diagnosis not present

## 2016-04-23 DIAGNOSIS — J449 Chronic obstructive pulmonary disease, unspecified: Secondary | ICD-10-CM | POA: Diagnosis not present

## 2016-04-23 HISTORY — DX: Personal history of urinary calculi: Z87.442

## 2016-04-23 LAB — CBC
HCT: 44 % (ref 39.0–52.0)
HEMOGLOBIN: 15.3 g/dL (ref 13.0–17.0)
MCH: 31.4 pg (ref 26.0–34.0)
MCHC: 34.8 g/dL (ref 30.0–36.0)
MCV: 90.2 fL (ref 78.0–100.0)
Platelets: 191 10*3/uL (ref 150–400)
RBC: 4.88 MIL/uL (ref 4.22–5.81)
RDW: 14.4 % (ref 11.5–15.5)
WBC: 6.8 10*3/uL (ref 4.0–10.5)

## 2016-04-23 LAB — BASIC METABOLIC PANEL
ANION GAP: 5 (ref 5–15)
BUN: 24 mg/dL — ABNORMAL HIGH (ref 6–20)
CHLORIDE: 110 mmol/L (ref 101–111)
CO2: 24 mmol/L (ref 22–32)
Calcium: 9.8 mg/dL (ref 8.9–10.3)
Creatinine, Ser: 1.01 mg/dL (ref 0.61–1.24)
GFR calc non Af Amer: >60 mL/min (ref >60)
Glucose, Bld: 101 mg/dL — ABNORMAL HIGH (ref 65–99)
POTASSIUM: 3.9 mmol/L (ref 3.5–5.1)
SODIUM: 139 mmol/L (ref 135–145)

## 2016-04-24 ENCOUNTER — Encounter (HOSPITAL_COMMUNITY)
Admission: RE | Disposition: A | Discharge status: Home / Self Care | Payer: Self-pay | Source: Ambulatory Visit | Attending: Urology

## 2016-04-24 ENCOUNTER — Encounter (HOSPITAL_COMMUNITY): Payer: Self-pay | Admitting: *Deleted

## 2016-04-24 ENCOUNTER — Ambulatory Visit (HOSPITAL_COMMUNITY)
Admission: RE | Admit: 2016-04-24 | Discharge: 2016-04-24 | Disposition: A | Discharge status: Home / Self Care | Payer: Commercial Managed Care - HMO | Source: Ambulatory Visit | Attending: Urology | Admitting: Urology

## 2016-04-24 ENCOUNTER — Ambulatory Visit (HOSPITAL_COMMUNITY): Payer: Commercial Managed Care - HMO | Admitting: Anesthesiology

## 2016-04-24 DIAGNOSIS — J449 Chronic obstructive pulmonary disease, unspecified: Secondary | ICD-10-CM | POA: Diagnosis not present

## 2016-04-24 DIAGNOSIS — E785 Hyperlipidemia, unspecified: Secondary | ICD-10-CM | POA: Insufficient documentation

## 2016-04-24 DIAGNOSIS — E669 Obesity, unspecified: Secondary | ICD-10-CM | POA: Insufficient documentation

## 2016-04-24 DIAGNOSIS — N132 Hydronephrosis with renal and ureteral calculous obstruction: Secondary | ICD-10-CM | POA: Insufficient documentation

## 2016-04-24 DIAGNOSIS — N200 Calculus of kidney: Secondary | ICD-10-CM | POA: Diagnosis not present

## 2016-04-24 DIAGNOSIS — Z885 Allergy status to narcotic agent status: Secondary | ICD-10-CM | POA: Insufficient documentation

## 2016-04-24 DIAGNOSIS — F17210 Nicotine dependence, cigarettes, uncomplicated: Secondary | ICD-10-CM | POA: Insufficient documentation

## 2016-04-24 DIAGNOSIS — Z87442 Personal history of urinary calculi: Secondary | ICD-10-CM | POA: Insufficient documentation

## 2016-04-24 DIAGNOSIS — I10 Essential (primary) hypertension: Secondary | ICD-10-CM | POA: Diagnosis not present

## 2016-04-24 DIAGNOSIS — Z6829 Body mass index (BMI) 29.0-29.9, adult: Secondary | ICD-10-CM | POA: Insufficient documentation

## 2016-04-24 DIAGNOSIS — F329 Major depressive disorder, single episode, unspecified: Secondary | ICD-10-CM | POA: Insufficient documentation

## 2016-04-24 DIAGNOSIS — N202 Calculus of kidney with calculus of ureter: Secondary | ICD-10-CM | POA: Diagnosis not present

## 2016-04-24 DIAGNOSIS — N201 Calculus of ureter: Secondary | ICD-10-CM | POA: Diagnosis not present

## 2016-04-24 HISTORY — PX: CYSTOSCOPY/RETROGRADE/URETEROSCOPY/STONE EXTRACTION WITH BASKET: SHX5317

## 2016-04-24 HISTORY — DX: Headache: R51

## 2016-04-24 HISTORY — DX: Headache, unspecified: R51.9

## 2016-04-24 HISTORY — PX: CYSTOSCOPY WITH STENT PLACEMENT: SHX5790

## 2016-04-24 HISTORY — PX: HOLMIUM LASER APPLICATION: SHX5852

## 2016-04-24 HISTORY — DX: Chronic kidney disease, unspecified: N18.9

## 2016-04-24 SURGERY — CYSTOSCOPY, WITH CALCULUS REMOVAL USING BASKET
Anesthesia: General | Laterality: Right

## 2016-04-24 MED ORDER — OXYCODONE HCL 5 MG PO TABS: 5.0000 mg | ORAL_TABLET | ORAL | 0 refills | Status: DC | PRN

## 2016-04-24 MED ORDER — SODIUM CHLORIDE 0.9 % IV SOLN: 250.0000 mL | INTRAVENOUS | Status: DC | PRN

## 2016-04-24 MED ORDER — 0.9 % SODIUM CHLORIDE (POUR BTL) OPTIME
TOPICAL | Status: DC | PRN
  Administered 2016-04-24: 1000 mL

## 2016-04-24 MED ORDER — LIDOCAINE 2% (20 MG/ML) 5 ML SYRINGE
INTRAMUSCULAR | Status: AC
Start: 2016-04-24 — End: 2016-04-24
  Filled 2016-04-24: qty 5

## 2016-04-24 MED ORDER — OXYCODONE HCL 5 MG PO TABS
5.0000 mg | ORAL_TABLET | Freq: Once | ORAL | Status: AC | PRN
  Administered 2016-04-24: 5 mg via ORAL

## 2016-04-24 MED ORDER — LACTATED RINGERS IV SOLN
INTRAVENOUS | Status: DC
  Administered 2016-04-24 (×2): via INTRAVENOUS

## 2016-04-24 MED ORDER — ONDANSETRON HCL 4 MG/2ML IJ SOLN
INTRAMUSCULAR | Status: AC
Start: 2016-04-24 — End: 2016-04-24
  Filled 2016-04-24: qty 2

## 2016-04-24 MED ORDER — PROPOFOL 10 MG/ML IV BOLUS
INTRAVENOUS | Status: AC
  Filled 2016-04-24: qty 20

## 2016-04-24 MED ORDER — SODIUM CHLORIDE 0.9% FLUSH: 3.0000 mL | INTRAVENOUS | Status: DC | PRN

## 2016-04-24 MED ORDER — FENTANYL CITRATE (PF) 100 MCG/2ML IJ SOLN
25.0000 ug | INTRAMUSCULAR | Status: DC | PRN
  Administered 2016-04-24 (×2): 50 ug via INTRAVENOUS

## 2016-04-24 MED ORDER — CEFAZOLIN SODIUM-DEXTROSE 2-4 GM/100ML-% IV SOLN
2.0000 g | INTRAVENOUS | Status: AC
  Administered 2016-04-24: 2 g via INTRAVENOUS
  Filled 2016-04-24: qty 100

## 2016-04-24 MED ORDER — SODIUM CHLORIDE 0.9 % IR SOLN
Status: DC | PRN
  Administered 2016-04-24: 3000 mL

## 2016-04-24 MED ORDER — LIDOCAINE 2% (20 MG/ML) 5 ML SYRINGE
INTRAMUSCULAR | Status: DC | PRN
  Administered 2016-04-24: 100 mg via INTRAVENOUS

## 2016-04-24 MED ORDER — OXYCODONE HCL 5 MG PO TABS: 5.0000 mg | ORAL_TABLET | ORAL | Status: DC | PRN

## 2016-04-24 MED ORDER — ONDANSETRON HCL 4 MG/2ML IJ SOLN
INTRAMUSCULAR | Status: DC | PRN
  Administered 2016-04-24: 4 mg via INTRAVENOUS

## 2016-04-24 MED ORDER — CEFAZOLIN SODIUM-DEXTROSE 2-4 GM/100ML-% IV SOLN
INTRAVENOUS | Status: AC
  Filled 2016-04-24: qty 100

## 2016-04-24 MED ORDER — SODIUM CHLORIDE 0.9 % IR SOLN
Status: DC | PRN
  Administered 2016-04-24: 1000 mL

## 2016-04-24 MED ORDER — DEXAMETHASONE SODIUM PHOSPHATE 10 MG/ML IJ SOLN
INTRAMUSCULAR | Status: DC | PRN
  Administered 2016-04-24: 10 mg via INTRAVENOUS

## 2016-04-24 MED ORDER — DEXAMETHASONE SODIUM PHOSPHATE 10 MG/ML IJ SOLN
INTRAMUSCULAR | Status: AC
Start: 2016-04-24 — End: 2016-04-24
  Filled 2016-04-24: qty 1

## 2016-04-24 MED ORDER — OXYCODONE HCL 5 MG/5ML PO SOLN
5.0000 mg | Freq: Once | ORAL | Status: AC | PRN
  Filled 2016-04-24: qty 5

## 2016-04-24 MED ORDER — FENTANYL CITRATE (PF) 100 MCG/2ML IJ SOLN
INTRAMUSCULAR | Status: AC
  Administered 2016-04-24: 50 ug via INTRAVENOUS
  Filled 2016-04-24: qty 2

## 2016-04-24 MED ORDER — FENTANYL CITRATE (PF) 100 MCG/2ML IJ SOLN
INTRAMUSCULAR | Status: DC | PRN
  Administered 2016-04-24 (×4): 25 ug via INTRAVENOUS

## 2016-04-24 MED ORDER — PROPOFOL 10 MG/ML IV BOLUS
INTRAVENOUS | Status: DC | PRN
  Administered 2016-04-24: 200 mg via INTRAVENOUS

## 2016-04-24 MED ORDER — ONDANSETRON HCL 4 MG/2ML IJ SOLN: 4.0000 mg | Freq: Four times a day (QID) | INTRAMUSCULAR | Status: DC | PRN

## 2016-04-24 MED ORDER — SODIUM CHLORIDE 0.9% FLUSH: 3.0000 mL | Freq: Two times a day (BID) | INTRAVENOUS | Status: DC

## 2016-04-24 MED ORDER — FENTANYL CITRATE (PF) 100 MCG/2ML IJ SOLN
INTRAMUSCULAR | Status: AC
  Filled 2016-04-24: qty 2

## 2016-04-24 MED ORDER — OXYBUTYNIN CHLORIDE 5 MG PO TABS: 5.0000 mg | ORAL_TABLET | Freq: Three times a day (TID) | ORAL | 1 refills | Status: DC | PRN

## 2016-04-24 MED ORDER — ACETAMINOPHEN 325 MG PO TABS: 650.0000 mg | ORAL_TABLET | ORAL | Status: DC | PRN

## 2016-04-24 MED ORDER — ACETAMINOPHEN 650 MG RE SUPP
650.0000 mg | RECTAL | Status: DC | PRN
Start: 2016-04-24 — End: 2016-04-24
  Filled 2016-04-24: qty 1

## 2016-04-24 MED ORDER — OXYCODONE HCL 5 MG PO TABS
ORAL_TABLET | ORAL | Status: AC
  Administered 2016-04-24: 5 mg via ORAL
  Filled 2016-04-24: qty 1

## 2016-04-24 MED ORDER — SULFAMETHOXAZOLE-TRIMETHOPRIM 800-160 MG PO TABS: 1.0000 | ORAL_TABLET | Freq: Two times a day (BID) | ORAL | 0 refills | Status: DC

## 2016-04-24 SURGICAL SUPPLY — 25 items
BAG URO CATCHER STRL LF (MISCELLANEOUS) ×2 IMPLANT
BASKET DAKOTA 1.9FR 11X120 (BASKET) ×1 IMPLANT
BASKET LASER NITINOL 1.9FR (BASKET) ×1 IMPLANT
BASKET ZERO TIP NITINOL 2.4FR (BASKET) IMPLANT
BSKT STON RTRVL 120 1.9FR (BASKET)
BSKT STON RTRVL ZERO TP 2.4FR (BASKET)
CATH INTERMIT  6FR 70CM (CATHETERS) ×1 IMPLANT
CLOTH BEACON ORANGE TIMEOUT ST (SAFETY) ×2 IMPLANT
EXTRACTOR STONE NITINOL NGAGE (UROLOGICAL SUPPLIES) ×1 IMPLANT
FIBER LASER FLEXIVA 365 (UROLOGICAL SUPPLIES) IMPLANT
FIBER LASER TRAC TIP (UROLOGICAL SUPPLIES) ×1 IMPLANT
GLOVE BIOGEL M 8.0 STRL (GLOVE) ×2 IMPLANT
GOWN STRL REUS W/ TWL XL LVL3 (GOWN DISPOSABLE) ×1 IMPLANT
GOWN STRL REUS W/TWL LRG LVL3 (GOWN DISPOSABLE) ×3 IMPLANT
GOWN STRL REUS W/TWL XL LVL3 (GOWN DISPOSABLE) ×2
GUIDEWIRE ANG ZIPWIRE 038X150 (WIRE) ×1 IMPLANT
GUIDEWIRE STR DUAL SENSOR (WIRE) ×2 IMPLANT
IV NS 1000ML (IV SOLUTION)
IV NS 1000ML BAXH (IV SOLUTION) ×1 IMPLANT
MANIFOLD NEPTUNE II (INSTRUMENTS) ×2 IMPLANT
PACK CYSTO (CUSTOM PROCEDURE TRAY) ×2 IMPLANT
SHEATH ACCESS URETERAL 38CM (SHEATH) ×1 IMPLANT
SHEATH ACCESS URETERAL 54CM (SHEATH) ×1 IMPLANT
STENT URET 6FRX26 CONTOUR (STENTS) ×1 IMPLANT
TUBING CONNECTING 10 (TUBING) ×2 IMPLANT

## 2016-04-24 NOTE — Op Note (Signed)
Preoperative diagnosis: 15 millimeter right renal pelvic stone, 6 millimeter right distal ureteral stone.  Postoperative diagnosis: 15 millimeter right interpolar calyceal stone, 6 millimeter right distal ureteral stone.  Principal procedure: Cystoscopy, right retrograde ureteropyelogram with fluoroscopic interpretation, right ureteroscopy, holmium laser lithotripsy and extraction of right renal calyceal stone, extraction of right distal ureteral stone, placement of 6 French by 26 centimeter contour double-J stent with tether  Surgeon: Yanelly Cantrelle  Anesthesia: Gen.  Complications: None.  Specimen: Stone, stone fragments, to the patient's family.  Drains: 6 [place] by 26 centimeter contour double-J stent with tether  Estimated blood loss: Less than 10 mL  Indications: 56 year old male recently diagnosed with a large right renal pelvic stone as well as a right distal ureteral stone.  He presented with gross hematuria.  Hematuria.  CT scan showed the above findings.  There was mild hydroureteronephrosis.  Because the patient's stone burden, it was recommended that he undergo ureteroscopy with laser and extraction of all ureteral stones.  Alternative of percutaneous nephrolithotomy was also discussed, as this was a fairly large proximal renal stone.  Risks and complications.  The procedure have been discussed with the patient.  He understands these and desires to proceed.  Findings: Urethra and prostatic urethra were normal.  Bladder was normal without urothelial abnormalities or foreign bodies.  Right distal ureteral stone was present as well as the large stone, now present in the interpolar calyx on the right side.  Retrograde ureteropyelogram revealed a filling defect right at the ureterovesical junction with a slightly dilated right ureter.  There was no filling defect in the renal pelvis.  There was no pyelocaliectasis.  Description of procedure: The patient was properly identified in the holding  area.  His right side was properly marked.  He received preoperative IV antibiotics.  He was taken to the operating room where general anesthetic was administered.  He was placed in the dorsolithotomy position.  Genitalia and perineum were prepped and draped.  Proper timeout was performed.  A 21 French panendoscope was advanced under direct vision through his urethra which was without lesions.  The bladder was inspected.  Ureteral orifices normal in configuration and location.  The right ureter was cannulated with a 6 [place] open-ended catheter.  Omnipaque was used to perform a retrograde ureteropyelogram.  Findings were dictated above.  A 0.038 inch sensor-tip guidewire was advanced to the open-ended catheter, passed the right distal ureteral stone with some difficulty, and then quite easily into the right upper pole calyceal system.  The open-ended catheter was then removed.  I dilated the right ureterovesical junction, sequentially with first a 83 [place] and then the 12/14 ureteral access catheter.  The access catheter was then removed.  I then passed the short rigid ureteroscope, through the urethra and into the right ureter.  The stone was identified.  It was then grasped with the [place] basket.  With some traction, it was removed without laser fragmentation intact.  Following this, the ureteroscope was removed.  Knowing that I needed to get into the right renal collecting system, the flexible scope would be utilized for this.  Over top of the guidewire, I passed the medium and then the long ureteral access sheath.  This easily passed into the right renal pelvis.  The core was removed.  The flexible ureteroscope was then advanced through the access sheath.  Pelvis was free of any stone.  The only stone present was in the interpolar calyx posteriorly.  This was identified, and using the  200 micron fiber, laser energy was applied to the stone at a rate of 30 hertz and a power of 8 joules.  The stone was  fragmented into multiple smaller fragments.  Sand-like fragments were evident, but the larger fragments, over.  Of approximately 1-1/2 hours, were extracted with the basket.  The [place] basket was used to first, but this was inadequate in grasping the fragments.  I then used an engage basket for this.  All larger fragments were easily extracted through the access sheath.  Smaller sand-like fragments would not he engaged by the basket, and were left in the collecting system, with then easily being pass along the stent eventually.  Systematic inspection of the calyceal system was then performed.  Some of the larger fragments in the upper pole calyx were then extracted.  No further significant fragments were present in NAD.  The calyces, and at this point, the ureteroscope was removed.  I replaced the sensor-tip guidewire through the access catheter.  The access catheter was removed.  The guidewire was backloaded through the 21 [place] cystoscope.  Under direct vision, a 26 centimeter 6 [place] contour double-J stent was passed into the right ureter.  Once the guidewire was removed, it was adequately engaged, with the proximal and distal curl seen fluoroscopically and cystoscopically, respectively.  The bladder was drained.  The scope was removed.  The tether was brought through the urethra, and then taped to the penis.  Patient tolerated procedure well.  He was then awakened and taken to the PACU in stable condition.

## 2016-04-24 NOTE — Transfer of Care (Signed)
Immediate Anesthesia Transfer of Care Note  Patient: Tom Lane  Procedure(s) Performed: Procedure(s): CYSTOSCOPY/RETROGRADE/URETEROSCOPY/STONE EXTRACTION WITH BASKETAND STENT PLACEMENT (Right) CYSTOSCOPY WITH STENT PLACEMENT (Right) HOLMIUM LASER APPLICATION (Right)  Patient Location: PACU  Anesthesia Type:General  Level of Consciousness: awake, alert  and oriented  Airway & Oxygen Therapy: Patient Spontanous Breathing and Patient connected to face mask oxygen  Post-op Assessment: Report given to RN and Post -op Vital signs reviewed and stable  Post vital signs: Reviewed and stable  Last Vitals:  Vitals:   04/24/16 1301  BP: 132/78  Pulse: 98  Resp: 18  Temp: 36.8 C    Last Pain:  Vitals:   04/24/16 1331  TempSrc:   PainSc: 3       Patients Stated Pain Goal: 4 ([id number])  Complications: No apparent anesthesia complications

## 2016-04-24 NOTE — H&P (Signed)
Urology History and Physical Exam  CC: right-sided kidney stones  HPI: 56 year old male present this time for endoscopic management of a large right proximal ureteral and a smaller right distal ureteral stone.  These were diagnosed recently after the patient presented with gross hematuria.  He did have a prior history of urolithiasis.  CT hematuria protocol was performed.  This revealed an 8 x 10 x 17 mm right proximal ureteral stone and a 6 x 6 x 7 mm right distal ureteral stone.  Treatment options were discussed with the patient.  As he has multiple locations of the stones, I suggested that we proceed with endoscopic management with holmium laser lithotripsy.  The procedure, as well as risks and complications have been discussed with him.  He understands these and desires to proceed.  PMH: Past Medical History:  Diagnosis Date  . Chest pain, unspecified   . Chronic airway obstruction, not elsewhere classified   . Depressive disorder, not elsewhere classified   . Dermatophytosis of the body   . Dyspepsia and other specified disorders of function of stomach   . History of kidney stones   . Obesity, unspecified   . Other abnormal blood chemistry   . Other and unspecified hyperlipidemia   . Other malaise and fatigue   . Overweight(278.02)   . Pain in limb   . Psychosexual dysfunction with inhibited sexual excitement   . Tobacco use disorder   . Unspecified disorder of skin and subcutaneous tissue   . Unspecified essential hypertension   . Wheezing     PSH: Past Surgical History:  Procedure Laterality Date  . ANAL FISSURE REPAIR  1985  . right renal stone removed  1983   Dr Serita Butcher    Allergies: Allergies  Allergen Reactions  . Codeine Itching and Swelling    Medications: No prescriptions prior to admission.     Social History: Social History   Social History  . Marital status: Married    Spouse name: N/A  . Number of children: N/A  . Years of education: N/A    Occupational History  . Not on file.   Social History Main Topics  . Smoking status: Current Every Day Smoker    Packs/day: 1.00    Years: 40.00    Types: Cigarettes  . Smokeless tobacco: Never Used  . Alcohol use 0.0 oz/week     Comment: Patient says he drinks around once a month.  . Drug use: No  . Sexual activity: Not on file   Other Topics Concern  . Not on file   Social History Narrative   Yes, eats/drinks things with caffeine   Married since [place] in a house 1 stories, 3 persons, 2 cats   Current or past profession- Administrator       Family History: Family History  Problem Relation Age of Onset  . Diabetes Father   . Stroke Father   . Stroke Sister   . Diabetes Sister     Review of Systems: GU Review Male: Patient reports frequent urination, hard to postpone urination, burning/ pain with urination, get up at night to urinate, and stream starts and stops. Patient denies leakage of urine, trouble starting your stream, have to strain to urinate , erection problems, and penile pain. Gastrointestinal (Upper): Patient reports nausea and vomiting. Patient denies indigestion/ heartburn. Gastrointestinal (Lower): Patient denies diarrhea and constipation. Constitutional: Patient reports night sweats. Patient denies fever, weight loss, and fatigue. Skin: Patient reports itching.  Patient denies skin rash/ lesion. Eyes: Patient denies blurred vision and double vision. Ears/ Nose/ Throat: Patient denies sore throat and sinus problems. Hematologic/Lymphatic: Patient denies swollen glands and easy bruising. Cardiovascular: Patient denies leg swelling and chest pains. Respiratory: Patient reports cough. Patient denies shortness of breath. Endocrine: Patient denies excessive thirst. Musculoskeletal: Patient reports back pain. Patient denies joint pain. Neurological: Patient denies headaches and                 Physical Exam: @VITALS2 @ Constitutional: Well-nourished. No  physical deformities. Normally developed. Good grooming. Neck: Neck symmetrical, not swollen. Normal tracheal position. Respiratory: No labored breathing, no use of accessory muscles. Cardiovascular: Normal temperature, normal extremity pulses, no swelling, no varicosities. Lymphatic: No enlargement of neck, axillae, groin. Skin: No paleness, no jaundice, no cyanosis. No lesion, no ulcer, no rash. Neurologic / Psychiatric: Oriented to time, oriented to place, oriented to person. No depression, no anxiety, no agitation. Gastrointestinal: No mass, no tenderness, no rigidity, non obese abdomen. Eyes: Normal conjunctivae. Normal eyelids. Ears, Nose, Mouth, and Throat: Left ear no scars, no lesions, no masses. Right ear no scars, no lesions, no masses. Nose no scars, no lesions, no masses. Normal hearing. Normal lips. Musculoskeletal: Normal gait and station of head and neck. 2  Studies:  Recent Labs     04/23/16  1501  HGB  15.3  WBC  6.8  PLT  191    Recent Labs     04/23/16  1501  NA  139  K  3.9  CL  110  CO2  24  BUN  24*  CREATININE  1.01  CALCIUM  9.8  GFRNONAA  >60  GFRAA  >60     No results for input(s): INR, APTT in the last 72 hours.  Invalid input(s): PT   Invalid input(s): ABG    Assessment:  Large right proximal, smaller right distal ureteral calculi  Plan: Cystoscopy, right retrograde ureteropyelogram right ureteroscopy, holmium laser lithotripsy and extraction of right ureteral calculi, right double-J stent placement

## 2016-04-24 NOTE — Anesthesia Preprocedure Evaluation (Signed)
Anesthesia Evaluation  Patient identified by MRN, date of birth, ID band Patient awake    Reviewed: Allergy & Precautions, H&P , NPO status , Patient's Chart, lab work & pertinent test results  Airway Mallampati: II   Neck ROM: full    Dental   Pulmonary COPD, Current Smoker,    breath sounds clear to auscultation       Cardiovascular hypertension,  Rhythm:regular Rate:Normal     Neuro/Psych  Headaches, PSYCHIATRIC DISORDERS Depression    GI/Hepatic   Endo/Other  obese  Renal/GU stones     Musculoskeletal   Abdominal   Peds  Hematology   Anesthesia Other Findings   Reproductive/Obstetrics                             Anesthesia Physical Anesthesia Plan  ASA: III  Anesthesia Plan: General   Post-op Pain Management:    Induction: Intravenous  Airway Management Planned: LMA  Additional Equipment:   Intra-op Plan:   Post-operative Plan:   Informed Consent: I have reviewed the patients History and Physical, chart, labs and discussed the procedure including the risks, benefits and alternatives for the proposed anesthesia with the patient or authorized representative who has indicated his/her understanding and acceptance.     Plan Discussed with: CRNA, Anesthesiologist and Surgeon  Anesthesia Plan Comments:         Anesthesia Quick Evaluation

## 2016-04-24 NOTE — Discharge Instructions (Signed)
General Anesthesia, Adult, Care After These instructions provide you with information about caring for yourself after your procedure. Your health care provider may also give you more specific instructions. Your treatment has been planned according to current medical practices, but problems sometimes occur. Call your health care provider if you have any problems or questions after your procedure. What can I expect after the procedure? After the procedure, it is common to have: Vomiting. A sore throat. Mental slowness. It is common to feel: Nauseous. Cold or shivery. Sleepy. Tired. Sore or achy, even in parts of your body where you did not have surgery. Follow these instructions at home: For at least 24 hours after the procedure: Do not: Participate in activities where you could fall or become injured. Drive. Use heavy machinery. Drink alcohol. Take sleeping pills or medicines that cause drowsiness. Make important decisions or sign legal documents. Take care of children on your own. Rest. Eating and drinking If you vomit, drink water, juice, or soup when you can drink without vomiting. Drink enough fluid to keep your urine clear or pale yellow. Make sure you have little or no nausea before eating solid foods. Follow the diet recommended by your health care provider. General instructions Have a responsible adult stay with you until you are awake and alert. Return to your normal activities as told by your health care provider. Ask your health care provider what activities are safe for you. Take over-the-counter and prescription medicines only as told by your health care provider. If you smoke, do not smoke without supervision. Keep all follow-up visits as told by your health care provider. This is important. Contact a health care provider if: You continue to have nausea or vomiting at home, and medicines are not helpful. You cannot drink fluids or start eating again. You cannot  urinate after 8-12 hours. You develop a skin rash. You have fever. You have increasing redness at the site of your procedure. Get help right away if: You have difficulty breathing. You have chest pain. You have unexpected bleeding. You feel that you are having a life-threatening or urgent problem. This information is not intended to replace advice given to you by your health care provider. Make sure you discuss any questions you have with your health care provider. Document Released: 07/07/2000 Document Revised: 09/03/2015 Document Reviewed: 03/15/2015 Elsevier Interactive Patient Education  2017 [place] may see some blood in the urine and may have some burning with urination for 48-72 hours. You also may notice that you have to urinate more frequently or urgently after your procedure which is normal.  2. You should call should you develop an inability urinate, fever > 101, persistent nausea and vomiting that prevents you from eating or drinking to stay hydrated.  3. If you have a stent, you will likely urinate more frequently and urgently until the stent is removed and you may experience some discomfort/pain in the lower abdomen and flank especially when urinating. You may take pain medication prescribed to you if needed for pain. You may also intermittently have blood in the urine until the stent is removed. OK to remove stent Monday morning. 4. If you have a catheter, you will be taught how to take care of the catheter by the nursing staff prior to discharge from the hospital.  You may periodically feel a strong urge to void with the catheter in place.  This is a bladder spasm and most often can  occur when having a bowel movement or moving around. It is typically self-limited and usually will stop after a few minutes.  You may use some Vaseline or Neosporin around the tip of the catheter to reduce friction at the tip of the penis. You may also see some blood in the  urine.  A very small amount of blood can make the urine look quite red.  As long as the catheter is draining well, there usually is not a problem.  However, if the catheter is not draining well and is bloody, you should call the office [phone]) to notify us.       5.  You might want to wait until Tuesday to go back to work.  Sometimes, there is some pain the day of the stent removal.

## 2016-04-24 NOTE — Anesthesia Procedure Notes (Signed)
Procedure Name: LMA Insertion Date/Time: 04/24/2016 3:39 PM Performed by: Dione Booze Pre-anesthesia Checklist: Suction available, Emergency Drugs available, Patient identified and Patient being monitored Patient Re-evaluated:Patient Re-evaluated prior to inductionOxygen Delivery Method: Circle system utilized Preoxygenation: Pre-oxygenation with 100% oxygen Intubation Type: IV induction LMA: LMA inserted LMA Size: 5.0 Number of attempts: 1 Placement Confirmation: positive ETCO2 and breath sounds checked- equal and bilateral Tube secured with: Tape Dental Injury: Teeth and Oropharynx as per pre-operative assessment

## 2016-04-25 ENCOUNTER — Encounter (HOSPITAL_COMMUNITY): Payer: Self-pay | Admitting: Urology

## 2016-04-28 NOTE — Anesthesia Postprocedure Evaluation (Addendum)
Anesthesia Post Note  Patient: KRYSTIAN POLISENO  Procedure(s) Performed: Procedure(s) (LRB): CYSTOSCOPY/RETROGRADE/URETEROSCOPY/STONE EXTRACTION WITH BASKETAND STENT PLACEMENT (Right) CYSTOSCOPY WITH STENT PLACEMENT (Right) HOLMIUM LASER APPLICATION (Right)  Patient location during evaluation: PACU Anesthesia Type: General Level of consciousness: awake and alert and patient cooperative Pain management: pain level controlled Vital Signs Assessment: post-procedure vital signs reviewed and stable Respiratory status: spontaneous breathing and respiratory function stable Cardiovascular status: stable Anesthetic complications: no       Last Vitals:  Vitals:   04/24/16 1815 04/24/16 1825  BP: 136/82 118/73  Pulse: 92 84  Resp: 14 15  Temp:  36.7 C    Last Pain:  Vitals:   04/24/16 1833  TempSrc:   PainSc: [place]

## 2016-05-26 DIAGNOSIS — N200 Calculus of kidney: Secondary | ICD-10-CM | POA: Diagnosis not present

## 2016-06-25 DIAGNOSIS — N200 Calculus of kidney: Secondary | ICD-10-CM | POA: Diagnosis not present

## 2016-07-01 ENCOUNTER — Other Ambulatory Visit: Payer: Commercial Managed Care - HMO

## 2016-07-01 DIAGNOSIS — Z87442 Personal history of urinary calculi: Secondary | ICD-10-CM

## 2016-07-01 DIAGNOSIS — I10 Essential (primary) hypertension: Secondary | ICD-10-CM

## 2016-07-01 DIAGNOSIS — R310 Gross hematuria: Secondary | ICD-10-CM

## 2016-07-01 DIAGNOSIS — Z0000 Encounter for general adult medical examination without abnormal findings: Secondary | ICD-10-CM

## 2016-07-01 LAB — CBC WITH DIFFERENTIAL/PLATELET
Basophils Absolute: 0 cells/uL (ref 0–200)
Basophils Relative: 0 %
Eosinophils Absolute: 315 cells/uL (ref 15–500)
Eosinophils Relative: 5 %
HCT: 45.8 % (ref 38.5–50.0)
Hemoglobin: 15.8 g/dL (ref 13.2–17.1)
Lymphocytes Relative: 35 %
Lymphs Abs: 2205 cells/uL (ref 850–3900)
MCH: 31.2 pg (ref 27.0–33.0)
MCHC: 34.5 g/dL (ref 32.0–36.0)
MCV: 90.5 fL (ref 80.0–100.0)
MPV: 9.6 fL (ref 7.5–12.5)
Monocytes Absolute: 693 cells/uL (ref 200–950)
Monocytes Relative: 11 %
Neutro Abs: 3087 cells/uL (ref 1500–7800)
Neutrophils Relative %: 49 %
Platelets: 180 10*3/uL (ref 140–400)
RBC: 5.06 MIL/uL (ref 4.20–5.80)
RDW: 14.5 % (ref 11.0–15.0)
WBC: 6.3 10*3/uL (ref 3.8–10.8)

## 2016-07-01 LAB — LIPID PANEL
Cholesterol: 148 mg/dL (ref <200)
HDL: 59 mg/dL (ref >40)
LDL Cholesterol: 80 mg/dL (ref <100)
Total CHOL/HDL Ratio: 2.5 Ratio (ref <5.0)
Triglycerides: 44 mg/dL (ref <150)
VLDL: 9 mg/dL (ref <30)

## 2016-07-01 LAB — COMPLETE METABOLIC PANEL WITH GFR
ALT: 29 U/L (ref 9–46)
AST: 19 U/L (ref 10–35)
Albumin: 4.3 g/dL (ref 3.6–5.1)
Alkaline Phosphatase: 60 U/L (ref 40–115)
BUN: 24 mg/dL (ref 7–25)
CO2: 27 mmol/L (ref 20–31)
Calcium: 9.7 mg/dL (ref 8.6–10.3)
Chloride: 108 mmol/L (ref 98–110)
Creat: 1.04 mg/dL (ref 0.70–1.33)
GFR, Est African American: >89 mL/min (ref >=60)
GFR, Est Non African American: 80 mL/min (ref >=60)
Glucose, Bld: 99 mg/dL (ref 65–99)
Potassium: 4.1 mmol/L (ref 3.5–5.3)
Sodium: 141 mmol/L (ref 135–146)
Total Bilirubin: 0.3 mg/dL (ref 0.2–1.2)
Total Protein: 6 g/dL — ABNORMAL LOW (ref 6.1–8.1)

## 2016-07-03 ENCOUNTER — Ambulatory Visit (INDEPENDENT_AMBULATORY_CARE_PROVIDER_SITE_OTHER): Payer: Commercial Managed Care - HMO | Admitting: Internal Medicine

## 2016-07-03 ENCOUNTER — Encounter: Payer: Self-pay | Admitting: Internal Medicine

## 2016-07-03 VITALS — BP 128/78 | HR 81 | Temp 97.7°F | Ht 71.0 in | Wt 223.0 lb

## 2016-07-03 DIAGNOSIS — R3911 Hesitancy of micturition: Secondary | ICD-10-CM | POA: Diagnosis not present

## 2016-07-03 DIAGNOSIS — E7800 Pure hypercholesterolemia, unspecified: Secondary | ICD-10-CM

## 2016-07-03 DIAGNOSIS — F17200 Nicotine dependence, unspecified, uncomplicated: Secondary | ICD-10-CM

## 2016-07-03 DIAGNOSIS — E6609 Other obesity due to excess calories: Secondary | ICD-10-CM | POA: Diagnosis not present

## 2016-07-03 DIAGNOSIS — R310 Gross hematuria: Secondary | ICD-10-CM | POA: Diagnosis not present

## 2016-07-03 DIAGNOSIS — J449 Chronic obstructive pulmonary disease, unspecified: Secondary | ICD-10-CM

## 2016-07-03 DIAGNOSIS — I10 Essential (primary) hypertension: Secondary | ICD-10-CM | POA: Diagnosis not present

## 2016-07-03 DIAGNOSIS — Z23 Encounter for immunization: Secondary | ICD-10-CM | POA: Diagnosis not present

## 2016-07-03 DIAGNOSIS — Z6831 Body mass index (BMI) 31.0-31.9, adult: Secondary | ICD-10-CM | POA: Diagnosis not present

## 2016-07-03 MED ORDER — TAMSULOSIN HCL 0.4 MG PO CAPS: 0.4000 mg | ORAL_CAPSULE | Freq: Every day | ORAL | 3 refills | Status: DC

## 2016-07-03 NOTE — Progress Notes (Signed)
Location:  [facility] clinic Provider:  Darnel Mchan L. Mariea Clonts, D.O., C.M.D.  Code Status: full code Goals of Care:  Advanced Directives 07/03/2016  Does Patient Have a Medical Advance Directive? No  Would patient like information on creating a medical advance directive? No - Patient declined   Chief Complaint  Patient presents with  . Medical Management of Chronic Issues    4 months med management    HPI: Patient is a 56 y.o. [address] seen today for medical management of chronic diseases.    He is on tamsulosin now at hs.  Flow improved almost immediately.    Hematuria resolved after stones removed, but no more since.    Reports his kidney is not draining properly.  He has been doing serial USs on it.    Tobacco abuse--still smoking between a 1/2ppd and 1ppd.  He got laid off and has gained weight as a result. Hasn't been able to work outside with the weather.  There are two cartons left of cigarettes and he claims if his wife stays stopped and he stays busy, he can quit too and they won't buy more.    Past Medical History:  Diagnosis Date  . Chest pain, unspecified   . Chronic airway obstruction, not elsewhere classified   . Chronic kidney disease    renal and ureteral stones  . Depressive disorder, not elsewhere classified   . Dermatophytosis of the body   . Dyspepsia and other specified disorders of function of stomach   . Headache    frequently, daily   . History of kidney stones   . Obesity, unspecified   . Other abnormal blood chemistry   . Other and unspecified hyperlipidemia   . Other malaise and fatigue   . Overweight(278.02)   . Pain in limb   . Psychosexual dysfunction with inhibited sexual excitement   . Tobacco use disorder   . Unspecified disorder of skin and subcutaneous tissue   . Unspecified essential hypertension   . Wheezing     Past Surgical History:  Procedure Laterality Date  . ANAL FISSURE REPAIR  1985  . CYSTOSCOPY WITH STENT PLACEMENT Right 04/24/2016   Procedure: CYSTOSCOPY WITH STENT PLACEMENT;  Surgeon: Franchot Gallo, MD;  Location: [facility];  Service: Urology;  Laterality: Right;  . CYSTOSCOPY/RETROGRADE/URETEROSCOPY/STONE EXTRACTION WITH BASKET Right 04/24/2016   Procedure: CYSTOSCOPY/RETROGRADE/URETEROSCOPY/STONE EXTRACTION WITH BASKETAND STENT PLACEMENT;  Surgeon: Franchot Gallo, MD;  Location: [facility];  Service: Urology;  Laterality: Right;  . HOLMIUM LASER APPLICATION Right 1/49/7026   Procedure: HOLMIUM LASER APPLICATION;  Surgeon: Franchot Gallo, MD;  Location: [facility];  Service: Urology;  Laterality: Right;  . right renal stone removed  1983   Dr Serita Butcher    Allergies  Allergen Reactions  . Codeine Itching and Swelling    Allergies as of 07/03/2016      Reactions   Codeine Itching, Swelling      Medication List       Accurate as of 07/03/16  8:12 AM. Always use your most recent med list.          amLODipine 10 MG tablet Commonly known as:  NORVASC Take 1 tablet (10 mg total) by mouth daily. Blood pressure   aspirin 81 MG tablet Take 81 mg by mouth daily.   atorvastatin 80 MG tablet Commonly known as:  LIPITOR Take 1 tablet (80 mg total) by mouth daily at 6 PM. cholesterol   Fish Oil 1200 MG Caps Take 1,200 mg by mouth daily.  Garlic 1610 MG Caps Take 1,000 mg by mouth daily.   multivitamin Tabs tablet Take 1 tablet by mouth daily.   RED YEAST RICE PO Take 1 capsule by mouth daily.       Review of Systems:  Review of Systems  Constitutional: Negative for chills, fever and malaise/fatigue.  HENT: Negative for congestion and hearing loss.   Eyes: Negative for blurred vision.       Glasses  Respiratory: Positive for cough. Negative for hemoptysis, sputum production and shortness of breath.   Cardiovascular: Negative for chest pain, palpitations and leg swelling.  Gastrointestinal: Negative for abdominal pain, blood in stool, constipation, diarrhea, heartburn, melena, nausea and vomiting.    Genitourinary: Negative for dysuria, frequency, hematuria and urgency.       Stream back to normal and no hematuria since just s/p lithotripsy  Musculoskeletal: Negative for falls, joint pain and myalgias.  Skin: Negative for itching and rash.  Neurological: Negative for dizziness, loss of consciousness and weakness.  Endo/Heme/Allergies: Does not bruise/bleed easily.  Psychiatric/Behavioral: Negative for depression and memory loss.       Mood "ok", stressed with medical bills since laid off    Health Maintenance  Topic Date Due  . Hepatitis C Screening  05-Jan-1961  . HIV Screening  09/18/1975  . TETANUS/TDAP  04/15/2019  . COLONOSCOPY  09/06/2024  . INFLUENZA VACCINE  Completed    Physical Exam: Vitals:   07/03/16 0808  BP: (!) 138/94  Pulse: 81  Temp: 97.7 F (36.5 C)  TempSrc: Oral  SpO2: 96%  Weight: 223 lb (101.2 kg)  Height: 5\' 11"  (1.803 m)   Body mass index is 31.1 kg/m. Physical Exam  Constitutional: He is oriented to person, place, and time. He appears well-developed and well-nourished. No distress.  HENT:  Head: Normocephalic and atraumatic.  Eyes: EOM are normal. Pupils are equal, round, and reactive to light.  glasses  Cardiovascular: Normal rate, regular rhythm, normal heart sounds and intact distal pulses.   Pulmonary/Chest: Effort normal. No respiratory distress. He has wheezes.  Wheezes at first in upper lung fields posteriorly  Abdominal: Soft. Bowel sounds are normal.  Musculoskeletal: Normal range of motion.  Neurological: He is alert and oriented to person, place, and time.  Skin: Skin is warm and dry. Capillary refill takes less than 2 seconds.  Psychiatric: He has a normal mood and affect. His behavior is normal. Judgment and thought content normal.    Labs reviewed: Basic Metabolic Panel:  Recent Labs  11/05/15 1143 04/23/16 1501 07/01/16 0806  NA 139 139 141  K 4.4 3.9 4.1  CL 107 110 108  CO2 23 24 27   GLUCOSE 88 101* 99  BUN  24 24* 24  CREATININE 1.09 1.01 1.04  CALCIUM 9.4 9.8 9.7   Liver Function Tests:  Recent Labs  11/05/15 1143 07/01/16 0806  AST 21 19  ALT 22 29  ALKPHOS 84 60  BILITOT 0.5 0.3  PROT 5.8* 6.0*  ALBUMIN 4.0 4.3   No results for input(s): LIPASE, AMYLASE in the last 8760 hours. No results for input(s): AMMONIA in the last 8760 hours. CBC:  Recent Labs  11/05/15 1143 04/23/16 1501 07/01/16 0806  WBC 7.7 6.8 6.3  NEUTROABS 4,312  --  3,087  HGB 16.5 15.3 15.8  HCT 47.8 44.0 45.8  MCV 89.5 90.2 90.5  PLT 198 191 180   Lipid Panel:  Recent Labs  11/05/15 1143 07/01/16 0806  CHOL 128 148  HDL 51 59  LDLCALC 64 80  TRIG 67 44  CHOLHDL 2.5 2.5   Lab Results  Component Value Date   HGBA1C 5.6 11/05/2015   Reviewed lithotripsy notes, need additional notes from urology  Assessment/Plan 1. Essential hypertension, benign -bp elevated after smoking this am on his way here -recheck improved to 128/78 afterward visit with me  2. Chronic obstructive pulmonary disease, unspecified COPD type ([place]) -noted in past, continues to smoke, has chronic cough and some mild sputum production, but no sob or wheezing that he's aware of  3. Gross hematuria -resolved after kidney stones removed  4. Class 1 obesity due to excess calories without serious comorbidity with body mass index (BMI) of 31.0 to 31.9 in adult -has gained some weight since last visit due to being laid off and sitting inside at home in bad weather -encouraged increased exercise  5. Tobacco use disorder -ongoing, claims he'll stop if his wife does (she had said she did but must have restarted smoking 1-2 cigarettes since that appt)  -discussed risks of ongoing habit and benefits  6. Pure hypercholesterolemia -cont statin therapy--lipids at goal considering no known cad or diabetes -may quit red rice yeast and garlic and just eat healthy balanced diet along with statin therapy, increase exercise  7. Urinary  hesitancy -better on tamsulosin, need urology records (last note I have is from Dec of last year)  8.  Need for pneumovax -given  9.  Need for shingrix  -form completed to determine insurance coverage/cost (pt has a lot of bills from his lithotripsy)  Labs/tests ordered:  No new today Next appt:  3 mos med mgt  Iness Pangilinan L. Demi Trieu, D.O. [facility] [address], [place] [address] Cell Phone (Mon-Fri 8am-5pm):  [phone] On Call:  [phone] & follow prompts after 5pm & weekends Office Phone:  [phone] Office Fax:  [phone]

## 2016-07-07 ENCOUNTER — Other Ambulatory Visit: Payer: Self-pay | Admitting: Internal Medicine

## 2016-07-07 ENCOUNTER — Ambulatory Visit (INDEPENDENT_AMBULATORY_CARE_PROVIDER_SITE_OTHER): Payer: Commercial Managed Care - HMO

## 2016-07-07 ENCOUNTER — Telehealth: Payer: Self-pay | Admitting: *Deleted

## 2016-07-07 DIAGNOSIS — Z23 Encounter for immunization: Secondary | ICD-10-CM | POA: Diagnosis not present

## 2016-07-07 MED ORDER — VARENICLINE TARTRATE 1 MG PO TABS: 1.0000 mg | ORAL_TABLET | Freq: Two times a day (BID) | ORAL | 1 refills | Status: DC

## 2016-07-07 MED ORDER — VARENICLINE TARTRATE 0.5 MG X 11 & 1 MG X 42 PO MISC: ORAL | 0 refills | Status: DC

## 2016-07-07 NOTE — Telephone Encounter (Signed)
Received fax from [facility] [phone]. Insurance Verification and Copay is $0.00. Explained to patient about possible Administration fee of $65. Appointment scheduled for 07/07/16. Verification sent for scanning.

## 2016-07-07 NOTE — Progress Notes (Signed)
Pt also wants chantix since wife plans to quit.

## 2016-08-18 DIAGNOSIS — N200 Calculus of kidney: Secondary | ICD-10-CM | POA: Diagnosis not present

## 2016-09-26 ENCOUNTER — Encounter: Payer: Self-pay | Admitting: Internal Medicine

## 2016-09-26 ENCOUNTER — Ambulatory Visit (INDEPENDENT_AMBULATORY_CARE_PROVIDER_SITE_OTHER): Payer: Commercial Managed Care - HMO | Admitting: Internal Medicine

## 2016-09-26 VITALS — BP 130/70 | HR 75 | Temp 98.0°F | Wt 216.0 lb

## 2016-09-26 DIAGNOSIS — E7800 Pure hypercholesterolemia, unspecified: Secondary | ICD-10-CM

## 2016-09-26 DIAGNOSIS — N200 Calculus of kidney: Secondary | ICD-10-CM

## 2016-09-26 DIAGNOSIS — R739 Hyperglycemia, unspecified: Secondary | ICD-10-CM | POA: Diagnosis not present

## 2016-09-26 DIAGNOSIS — J449 Chronic obstructive pulmonary disease, unspecified: Secondary | ICD-10-CM | POA: Diagnosis not present

## 2016-09-26 DIAGNOSIS — I10 Essential (primary) hypertension: Secondary | ICD-10-CM | POA: Diagnosis not present

## 2016-09-26 DIAGNOSIS — F17200 Nicotine dependence, unspecified, uncomplicated: Secondary | ICD-10-CM | POA: Diagnosis not present

## 2016-09-26 LAB — CBC WITH DIFFERENTIAL/PLATELET
Basophils Absolute: 0 cells/uL (ref 0–200)
Basophils Relative: 0 %
Eosinophils Absolute: 201 cells/uL (ref 15–500)
Eosinophils Relative: 3 %
HCT: 45.7 % (ref 38.5–50.0)
Hemoglobin: 16 g/dL (ref 13.2–17.1)
Lymphocytes Relative: 28 %
Lymphs Abs: 1876 cells/uL (ref 850–3900)
MCH: 31.3 pg (ref 27.0–33.0)
MCHC: 35 g/dL (ref 32.0–36.0)
MCV: 89.4 fL (ref 80.0–100.0)
MPV: 9.1 fL (ref 7.5–12.5)
Monocytes Absolute: 603 cells/uL (ref 200–950)
Monocytes Relative: 9 %
Neutro Abs: 4020 cells/uL (ref 1500–7800)
Neutrophils Relative %: 60 %
Platelets: 183 10*3/uL (ref 140–400)
RBC: 5.11 MIL/uL (ref 4.20–5.80)
RDW: 14.3 % (ref 11.0–15.0)
WBC: 6.7 10*3/uL (ref 3.8–10.8)

## 2016-09-26 LAB — COMPLETE METABOLIC PANEL WITH GFR
ALT: 21 U/L (ref 9–46)
AST: 16 U/L (ref 10–35)
Albumin: 4 g/dL (ref 3.6–5.1)
Alkaline Phosphatase: 73 U/L (ref 40–115)
BUN: 18 mg/dL (ref 7–25)
CO2: 22 mmol/L (ref 20–31)
Calcium: 10 mg/dL (ref 8.6–10.3)
Chloride: 109 mmol/L (ref 98–110)
Creat: 1.06 mg/dL (ref 0.70–1.33)
GFR, Est African American: >89 mL/min (ref >=60)
GFR, Est Non African American: 78 mL/min (ref >=60)
Glucose, Bld: 88 mg/dL (ref 65–99)
Potassium: 3.7 mmol/L (ref 3.5–5.3)
Sodium: 141 mmol/L (ref 135–146)
Total Bilirubin: 0.4 mg/dL (ref 0.2–1.2)
Total Protein: 5.7 g/dL — ABNORMAL LOW (ref 6.1–8.1)

## 2016-09-26 MED ORDER — ATORVASTATIN CALCIUM 80 MG PO TABS: 80.0000 mg | ORAL_TABLET | Freq: Every day | ORAL | 3 refills | Status: DC

## 2016-09-26 MED ORDER — AMLODIPINE BESYLATE 10 MG PO TABS: 10.0000 mg | ORAL_TABLET | Freq: Every day | ORAL | 3 refills | Status: DC

## 2016-09-26 NOTE — Progress Notes (Signed)
Location:  [facility] clinic Provider:  Yekaterina Escutia L. Mariea Clonts, D.O., C.M.D.  Code Status: full code Goals of Care:  Advanced Directives 07/03/2016  Does Patient Have a Medical Advance Directive? No  Would patient like information on creating a medical advance directive? No - Patient declined   Chief Complaint  Patient presents with  . Medical Management of Chronic Issues    74mth follow-up    HPI: Patient is a 56 y.o. male seen today for medical management of chronic diseases.    He's tired all of the time.  Goes to bed then gets up just as tired.  Has completely cut out carbs b/c of the extent of how sleepy he's been.  Sleeps well through the night.  Has not been snoring that he's aware of.  He's working 12 hrs a day.    Did check his own sugar once and it was 126.  This was nonfasting.  That happened after eating.  Notices incredibly sleepy after bread and potatoes.    He's cut way down on his cigarettes.  Less than 1/2ppd now. the cost was prohibitive to take chantix.    No longer having back pain.  Passed a kidney stone the other day but no pain or residual, just felt it come out.  Past Medical History:  Diagnosis Date  . Chest pain, unspecified   . Chronic airway obstruction, not elsewhere classified   . Chronic kidney disease    renal and ureteral stones  . Depressive disorder, not elsewhere classified   . Dermatophytosis of the body   . Dyspepsia and other specified disorders of function of stomach   . Headache    frequently, daily   . History of kidney stones   . Obesity, unspecified   . Other abnormal blood chemistry   . Other and unspecified hyperlipidemia   . Other malaise and fatigue   . Overweight(278.02)   . Pain in limb   . Psychosexual dysfunction with inhibited sexual excitement   . Tobacco use disorder   . Unspecified disorder of skin and subcutaneous tissue   . Unspecified essential hypertension   . Wheezing     Past Surgical History:  Procedure Laterality  Date  . ANAL FISSURE REPAIR  1985  . CYSTOSCOPY WITH STENT PLACEMENT Right 04/24/2016   Procedure: CYSTOSCOPY WITH STENT PLACEMENT;  Surgeon: Franchot Gallo, MD;  Location: [facility];  Service: Urology;  Laterality: Right;  . CYSTOSCOPY/RETROGRADE/URETEROSCOPY/STONE EXTRACTION WITH BASKET Right 04/24/2016   Procedure: CYSTOSCOPY/RETROGRADE/URETEROSCOPY/STONE EXTRACTION WITH BASKETAND STENT PLACEMENT;  Surgeon: Franchot Gallo, MD;  Location: [facility];  Service: Urology;  Laterality: Right;  . HOLMIUM LASER APPLICATION Right 2/40/9735   Procedure: HOLMIUM LASER APPLICATION;  Surgeon: Franchot Gallo, MD;  Location: [facility];  Service: Urology;  Laterality: Right;  . right renal stone removed  1983   Dr Serita Butcher    Allergies  Allergen Reactions  . Codeine Itching and Swelling    Allergies as of 09/26/2016      Reactions   Codeine Itching, Swelling      Medication List       Accurate as of 09/26/16 11:47 AM. Always use your most recent med list.          amLODipine 10 MG tablet Commonly known as:  NORVASC Take 1 tablet (10 mg total) by mouth daily. Blood pressure   aspirin 81 MG tablet Take 81 mg by mouth daily.   atorvastatin 80 MG tablet Commonly known as:  LIPITOR Take 1 tablet (  80 mg total) by mouth daily at 6 PM. cholesterol   Fish Oil 1200 MG Caps Take 1,200 mg by mouth daily.   multivitamin Tabs tablet Take 1 tablet by mouth daily.   tamsulosin 0.4 MG Caps capsule Commonly known as:  FLOMAX Take 1 capsule (0.4 mg total) by mouth daily.   varenicline 0.5 MG X 11 & 1 MG X 42 tablet Commonly known as:  CHANTIX STARTING MONTH PAK Take one 0.5 mg tablet by mouth once daily for 3 days, then increase to one 0.5 mg tablet twice daily for 4 days, then increase to one 1 mg tablet twice daily.   varenicline 1 MG tablet Commonly known as:  CHANTIX CONTINUING MONTH PAK Take 1 tablet (1 mg total) by mouth 2 (two) times daily.       Review of Systems:  Review of  Systems  Constitutional: Positive for malaise/fatigue and weight loss. Negative for chills and fever.  HENT: Negative for hearing loss.   Eyes: Negative for blurred vision.       Glasses  Respiratory: Positive for cough. Negative for shortness of breath.   Cardiovascular: Negative for chest pain, palpitations and leg swelling.  Gastrointestinal: Negative for abdominal pain, blood in stool, constipation and melena.  Genitourinary: Positive for frequency. Negative for dysuria, flank pain, hematuria and urgency.       Recent kidney stone  Musculoskeletal: Negative for falls.  Skin: Negative for itching and rash.  Neurological: Negative for dizziness, loss of consciousness and weakness.  Endo/Heme/Allergies:       Neck lymphadenopathy noted in past week which is gradually improving  Psychiatric/Behavioral: Negative for depression and memory loss. The patient does not have insomnia.     Health Maintenance  Topic Date Due  . Hepatitis C Screening  1960/04/24  . HIV Screening  09/18/1975  . INFLUENZA VACCINE  11/12/2016  . TETANUS/TDAP  04/15/2019  . COLONOSCOPY  09/06/2024    Physical Exam: Vitals:   09/26/16 1118  BP: 130/70  Pulse: 75  Temp: 98 F (36.7 C)  TempSrc: Oral  SpO2: 96%  Weight: 216 lb (98 kg)   Body mass index is 30.13 kg/m. Physical Exam  Constitutional: He is oriented to person, place, and time. He appears well-developed and well-nourished. No distress.  HENT:  Head: Normocephalic and atraumatic.  Eyes:  glasses  Neck:  Symmetric, nontender  Cardiovascular: Normal rate, regular rhythm, normal heart sounds and intact distal pulses.   Pulmonary/Chest: Effort normal. No respiratory distress. He has wheezes.  Abdominal: Soft. Bowel sounds are normal. He exhibits no distension. There is no tenderness.  Musculoskeletal: Normal range of motion.  Lymphadenopathy:    He has cervical adenopathy.  Neurological: He is alert and oriented to person, place, and time.   Skin: Skin is warm and dry.  Psychiatric: He has a normal mood and affect.    Labs reviewed: Basic Metabolic Panel:  Recent Labs  11/05/15 1143 04/23/16 1501 07/01/16 0806  NA 139 139 141  K 4.4 3.9 4.1  CL 107 110 108  CO2 23 24 27   GLUCOSE 88 101* 99  BUN 24 24* 24  CREATININE 1.09 1.01 1.04  CALCIUM 9.4 9.8 9.7   Liver Function Tests:  Recent Labs  11/05/15 1143 07/01/16 0806  AST 21 19  ALT 22 29  ALKPHOS 84 60  BILITOT 0.5 0.3  PROT 5.8* 6.0*  ALBUMIN 4.0 4.3   No results for input(s): LIPASE, AMYLASE in the last 8760 hours. No results  for input(s): AMMONIA in the last 8760 hours. CBC:  Recent Labs  11/05/15 1143 04/23/16 1501 07/01/16 0806  WBC 7.7 6.8 6.3  NEUTROABS 4,312  --  3,087  HGB 16.5 15.3 15.8  HCT 47.8 44.0 45.8  MCV 89.5 90.2 90.5  PLT 198 191 180   Lipid Panel:  Recent Labs  11/05/15 1143 07/01/16 0806  CHOL 128 148  HDL 51 59  LDLCALC 64 80  TRIG 67 44  CHOLHDL 2.5 2.5   Lab Results  Component Value Date   HGBA1C 5.6 11/05/2015    Assessment/Plan 1. Hyperglycemia - pt notes hyperresponsiveness to carb intake with lethargy, but also working longer hours so not clear if this is legitimately an issue, f/u sugar average - Hemoglobin A1c  2. Essential hypertension, benign -bp at goal, cont same regimen and monitor - CBC with Differential/Platelet - COMPLETE METABOLIC PANEL WITH GFR  3. Chronic obstructive pulmonary disease, unspecified COPD type ([place]) -cont to reduce tobacco use -not on inhalers, but does have some slight wheezing, ideally should see pulmonary for PFTs, but schedule limits visit times considerably and still smoking  4. Nephrolithiasis -passed another stone a few days back, but no further problems, monitor  5. Tobacco use disorder -continues to smoke, but less -chantix is too costly so could not purchase  6. Pure hypercholesterolemia -on lipitor with benefit, cont to monitor lipids, but did not  fast today  Labs/tests ordered: Orders Placed This Encounter  Procedures  . CBC with Differential/Platelet  . COMPLETE METABOLIC PANEL WITH GFR  . Hemoglobin A1c    Next appt:  02/05/2017   Makenley Shimp L. Kuba Shepherd, D.O. [facility] [address], [place] [address] Cell Phone (Mon-Fri 8am-5pm):  [phone] On Call:  [phone] & follow prompts after 5pm & weekends Office Phone:  [phone] Office Fax:  [phone]

## 2016-09-27 LAB — HEMOGLOBIN A1C
Hgb A1c MFr Bld: 5.2 % (ref <5.7)
Mean Plasma Glucose: 103 mg/dL

## 2016-09-29 ENCOUNTER — Encounter: Payer: Self-pay | Admitting: *Deleted

## 2016-10-13 ENCOUNTER — Other Ambulatory Visit: Payer: Self-pay | Admitting: Internal Medicine

## 2016-10-13 MED ORDER — ZOSTER VAC RECOMB ADJUVANTED 50 MCG/0.5ML IM SUSR: 0.5000 mL | Freq: Once | INTRAMUSCULAR | 0 refills | Status: AC

## 2016-10-13 NOTE — Progress Notes (Signed)
Pt's wife was here and requested his Rx for second shingrix also be sent to pharmacy.

## 2016-10-24 ENCOUNTER — Telehealth: Payer: Self-pay | Admitting: *Deleted

## 2016-10-24 NOTE — Telephone Encounter (Signed)
Patient wife, Tom Lane called and stated that insurance will not pay for Chantix. Patient wife wants to know if you will write him a Rx for the Nicotine Patch 21mg . Stated that she knows this is OTC but insurance will help and pay if he has a Rx. Would like called into [facility]. Patient wants Dr. Mariea Clonts to address and understands she will not be back till Tuesday. Please Advise.

## 2016-10-28 MED ORDER — NICOTINE 21-14-7 MG/24HR TD KIT: PACK | TRANSDERMAL | 0 refills | Status: DC

## 2016-10-28 NOTE — Telephone Encounter (Signed)
Patient wife notified and agreed. Faxed Rx to pharmacy.

## 2016-10-28 NOTE — Telephone Encounter (Signed)
Ok to Rx 21mg  nicotine patch for 1 month, then 14mg , then 7mg  for a month each, then d/c.  No smoking while on patches.

## 2016-11-04 ENCOUNTER — Other Ambulatory Visit: Payer: Self-pay | Admitting: Internal Medicine

## 2016-11-04 NOTE — Telephone Encounter (Signed)
Ordered 09/26/2016 #90 [id number]  Already ordered

## 2016-11-05 ENCOUNTER — Other Ambulatory Visit: Payer: Self-pay | Admitting: Internal Medicine

## 2016-11-16 DIAGNOSIS — Z23 Encounter for immunization: Secondary | ICD-10-CM | POA: Diagnosis not present

## 2016-12-03 NOTE — Addendum Note (Signed)
Addendum  created 12/03/16 1124 by Albertha Ghee, MD   Sign clinical note

## 2017-02-05 ENCOUNTER — Ambulatory Visit (INDEPENDENT_AMBULATORY_CARE_PROVIDER_SITE_OTHER): Payer: 59 | Admitting: Internal Medicine

## 2017-02-05 ENCOUNTER — Encounter: Payer: Self-pay | Admitting: Internal Medicine

## 2017-02-05 VITALS — BP 128/80 | HR 60 | Temp 97.9°F | Wt 208.0 lb

## 2017-02-05 DIAGNOSIS — I10 Essential (primary) hypertension: Secondary | ICD-10-CM | POA: Diagnosis not present

## 2017-02-05 DIAGNOSIS — R739 Hyperglycemia, unspecified: Secondary | ICD-10-CM

## 2017-02-05 DIAGNOSIS — J449 Chronic obstructive pulmonary disease, unspecified: Secondary | ICD-10-CM

## 2017-02-05 DIAGNOSIS — R3911 Hesitancy of micturition: Secondary | ICD-10-CM | POA: Diagnosis not present

## 2017-02-05 DIAGNOSIS — E7800 Pure hypercholesterolemia, unspecified: Secondary | ICD-10-CM | POA: Diagnosis not present

## 2017-02-05 DIAGNOSIS — Z716 Tobacco abuse counseling: Secondary | ICD-10-CM | POA: Diagnosis not present

## 2017-02-05 DIAGNOSIS — F17200 Nicotine dependence, unspecified, uncomplicated: Secondary | ICD-10-CM | POA: Diagnosis not present

## 2017-02-05 DIAGNOSIS — Z23 Encounter for immunization: Secondary | ICD-10-CM | POA: Diagnosis not present

## 2017-02-05 DIAGNOSIS — F321 Major depressive disorder, single episode, moderate: Secondary | ICD-10-CM | POA: Diagnosis not present

## 2017-02-05 MED ORDER — ESCITALOPRAM OXALATE 5 MG PO TABS: 5.0000 mg | ORAL_TABLET | Freq: Every day | ORAL | 3 refills | Status: DC

## 2017-02-05 NOTE — Patient Instructions (Signed)
Start on lexapro 5mg  daily.

## 2017-02-05 NOTE — Progress Notes (Signed)
Location:  [facility] clinic Provider:  Reannon Candella L. Mariea Clonts, D.O., C.M.D.  Code Status: full code Goals of Care:  Advanced Directives 07/03/2016  Does Patient Have a Medical Advance Directive? No  Would patient like information on creating a medical advance directive? No - Patient declined   Chief Complaint  Patient presents with  . Medical Management of Chronic Issues    69mh follow-up    HPI: Patient is a 56y.o. male seen today for medical management of chronic diseases.    Back to smoking again.  Smoking a 1/2 ppd.  Chantix was not covered and too expensive.  Wakes up in the middle of the night sometimes and doesn't know why.  Wonders if an antidepressant would help him.  Is feeling down and depressed in spells. Overeating.  Definitely lacks motivation. 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  Procrastinating a lot.  Did take an antidepressant once in the past that actually made him miss days--happened with zoloft.  Helped depression, but was so braindead he couldn't think.  He would have his family tell him about things they'd done together and he had no recollection of them from just a few days before.  wellbutrin was ineffective.    He had lost 100 lbs due to something in the tap water he reports.  He is back to drinking tap water and has gone from obese to overweight now.  He had been losing 12-13 lbs per week.  It affected the lining of his stomach and he had diarrhea then.  Normal bowels now.  Reports that episode occurred back in 1986.    Urinary symptoms improved with flomax.    Past Medical History:  Diagnosis Date  . Chest pain, unspecified   . Chronic airway obstruction, not elsewhere classified   . Chronic kidney disease    renal and ureteral stones  . Depressive disorder, not elsewhere classified   . Dermatophytosis of the body   . Dyspepsia and other specified disorders of function of stomach   . Headache    frequently, daily   . History  of kidney stones   . Obesity, unspecified   . Other abnormal blood chemistry   . Other and unspecified hyperlipidemia   . Other malaise and fatigue   . Overweight(278.02)   . Pain in limb   . Psychosexual dysfunction with inhibited sexual excitement   . Tobacco use disorder   . Unspecified disorder of skin and subcutaneous tissue   . Unspecified essential hypertension   . Wheezing     Past Surgical History:  Procedure Laterality Date  . ANAL FISSURE REPAIR  1985  . CYSTOSCOPY WITH STENT PLACEMENT Right 04/24/2016   Procedure: CYSTOSCOPY WITH STENT PLACEMENT;  Surgeon: SFranchot Gallo MD;  Location: [facility];  Service: Urology;  Laterality: Right;  . CYSTOSCOPY/RETROGRADE/URETEROSCOPY/STONE EXTRACTION WITH BASKET Right 04/24/2016   Procedure: CYSTOSCOPY/RETROGRADE/URETEROSCOPY/STONE EXTRACTION WITH BASKETAND STENT PLACEMENT;  Surgeon: SFranchot Gallo MD;  Location: [facility];  Service: Urology;  Laterality: Right;  . HOLMIUM LASER APPLICATION Right 11/95/0932  Procedure: HOLMIUM LASER APPLICATION;  Surgeon: SFranchot Gallo MD;  Location: [facility];  Service: Urology;  Laterality: Right;  . right renal stone removed  1983   Dr KSerita Butcher   Allergies  Allergen Reactions  . Codeine Itching and Swelling    Outpatient Encounter Prescriptions as of 02/05/2017  Medication Sig  . amLODipine (NORVASC) 10 MG tablet TAKE ONE TABLET BY MOUTH ONCE DAILY  FOR BLOOD PRESSURE  . aspirin 81 MG tablet Take 81 mg by mouth daily.   Marland Kitchen atorvastatin (LIPITOR) 80 MG tablet TAKE ONE TABLET BY MOUTH ONCE DAILY AT 6 PM FOR CHOLESTEROL  . multivitamin (ONE-A-DAY MEN'S) TABS Take 1 tablet by mouth daily.   . Omega-3 Fatty Acids (FISH OIL) 1200 MG CAPS Take 1,200 mg by mouth daily.   . tamsulosin (FLOMAX) 0.4 MG CAPS capsule Take 1 capsule (0.4 mg total) by mouth daily.  . [DISCONTINUED] Nicotine 21-14-7 MG/24HR KIT Apply 28m for one month, then apply 162mfor 1 month, then apply 29m329mor 1 month, then stop.  No smoking while on patches.   No facility-administered encounter medications on file as of 02/05/2017.     Review of Systems:  Review of Systems  Constitutional: Positive for malaise/fatigue. Negative for chills and fever.  Respiratory: Negative for cough and shortness of breath.   Cardiovascular: Negative for chest pain, palpitations and leg swelling.  Gastrointestinal: Negative for abdominal pain.  Genitourinary: Negative for dysuria.  Musculoskeletal: Negative for falls and joint pain.  Skin: Negative for itching and rash.  Neurological: Negative for dizziness and weakness.  Psychiatric/Behavioral: Positive for depression. Negative for memory loss.       Waking up in the middle of the night    Health Maintenance  Topic Date Due  . Hepatitis C Screening  09/14/10/1962 HIV Screening  09/18/1975  . INFLUENZA VACCINE  11/12/2016  . TETANUS/TDAP  04/15/2019  . COLONOSCOPY  09/06/2024    Physical Exam: Vitals:   02/05/17 1103  BP: 128/80  Pulse: 60  Temp: 97.9 F (36.6 C)  TempSrc: Oral  SpO2: 96%  Weight: 208 lb (94.3 kg)   Body mass index is 29.01 kg/m. Physical Exam  Constitutional: He is oriented to person, place, and time. He appears well-developed and well-nourished. No distress.  HENT:  Head: Normocephalic and atraumatic.  Cardiovascular: Normal rate, regular rhythm, normal heart sounds and intact distal pulses.   Pulmonary/Chest: Effort normal and breath sounds normal. No respiratory distress.  Abdominal: Soft. Bowel sounds are normal. He exhibits no distension. There is no tenderness.  Musculoskeletal: Normal range of motion.  Neurological: He is alert and oriented to person, place, and time.  Skin: Skin is warm and dry.  Psychiatric:  Flat affect today, was asleep when I walked in the room    Labs reviewed: Basic Metabolic Panel:  Recent Labs  04/23/16 1501 07/01/16 0806 09/26/16 1220  NA 139 141 141  K 3.9 4.1 3.7  CL 110 108 109  CO2 24 27  22   GLUCOSE 101* 99 88  BUN 24* 24 18  CREATININE 1.01 1.04 1.06  CALCIUM 9.8 9.7 10.0   Liver Function Tests:  Recent Labs  07/01/16 0806 09/26/16 1220  AST 19 16  ALT 29 21  ALKPHOS 60 73  BILITOT 0.3 0.4  PROT 6.0* 5.7*  ALBUMIN 4.3 4.0   No results for input(s): LIPASE, AMYLASE in the last 8760 hours. No results for input(s): AMMONIA in the last 8760 hours. CBC:  Recent Labs  04/23/16 1501 07/01/16 0806 09/26/16 1220  WBC 6.8 6.3 6.7  NEUTROABS  --  3,087 4,020  HGB 15.3 15.8 16.0  HCT 44.0 45.8 45.7  MCV 90.2 90.5 89.4  PLT 191 180 183   Lipid Panel:  Recent Labs  07/01/16 0806  CHOL 148  HDL 59  LDLCALC 80  TRIG 44  CHOLHDL 2.5   Lab Results  Component  Value Date   HGBA1C 5.2 09/26/2016    Assessment/Plan 1. Depression, major, single episode, moderate ([place]) - will see if he responds to lexapro therapy, did not benefit from wellbutrin before and zoloft caused him to miss periods of time - escitalopram (LEXAPRO) 5 MG tablet; Take 1 tablet (5 mg total) by mouth daily.  Dispense: 30 tablet; Refill: 3  2. Tobacco use disorder -ongoing, down to 1/2 ppd when smoked 2-3 ppd at one point  3. Tobacco abuse counseling -performed today, see above  4. Pure hypercholesterolemia - cont lipitor therapy - Lipid panel; Future  5. Essential hypertension, benign -cont norvasc 52m daily - CBC with Differential/Platelet; Future - COMPLETE METABOLIC PANEL WITH GFR; Future  6. Chronic obstructive pulmonary disease, unspecified COPD type ([place] -not on tx and has not wanted pulmonary evaluations -smoking cessation counseling provided   7. Urinary hesitancy -continues on flomax therapy  8. Hyperglycemia - continue to work on diet and exercise, weight down a little bit, but not really exercising now - Hemoglobin A1c; Future  9. Need for immunization against influenza - Flu Vaccine QUAD 6+ mos PF IM (Fluarix Quad PF) given  Labs/tests ordered:   Orders  Placed This Encounter  Procedures  . Flu Vaccine QUAD 6+ mos PF IM (Fluarix Quad PF)  . CBC with Differential/Platelet    Standing Status:   Future    Standing Expiration Date:   10/06/2017  . COMPLETE METABOLIC PANEL WITH GFR    Standing Status:   Future    Standing Expiration Date:   10/06/2017  . Hemoglobin A1c    Standing Status:   Future    Standing Expiration Date:   10/06/2017  . Lipid panel    Standing Status:   Future    Standing Expiration Date:   10/06/2017   Next appt:  06/11/2017 med mgt, labs before  Rayana Geurin L. Yug Loria, D.O. [facility] [address] [place] [address]Cell Phone (Mon-Fri 8am-5pm):  [phone] Call:  [phone]& follow prompts after 5pm & weekends Office Phone:  [phone] Fax:  [phone]

## 2017-05-25 ENCOUNTER — Other Ambulatory Visit: Payer: Self-pay | Admitting: Internal Medicine

## 2017-05-25 DIAGNOSIS — F321 Major depressive disorder, single episode, moderate: Secondary | ICD-10-CM

## 2017-06-09 ENCOUNTER — Other Ambulatory Visit: Payer: 59

## 2017-06-09 DIAGNOSIS — I10 Essential (primary) hypertension: Secondary | ICD-10-CM | POA: Diagnosis not present

## 2017-06-09 DIAGNOSIS — E7800 Pure hypercholesterolemia, unspecified: Secondary | ICD-10-CM

## 2017-06-09 DIAGNOSIS — R739 Hyperglycemia, unspecified: Secondary | ICD-10-CM

## 2017-06-10 LAB — HEMOGLOBIN A1C
Hgb A1c MFr Bld: 5.1 % of total Hgb (ref <5.7)
Mean Plasma Glucose: 100 (calc)
eAG (mmol/L): 5.5 (calc)

## 2017-06-10 LAB — CBC WITH DIFFERENTIAL/PLATELET
Basophils Absolute: 31 cells/uL (ref 0–200)
Basophils Relative: 0.5 %
Eosinophils Absolute: 248 cells/uL (ref 15–500)
Eosinophils Relative: 4 %
HCT: 46.5 % (ref 38.5–50.0)
Hemoglobin: 16.6 g/dL (ref 13.2–17.1)
Lymphs Abs: 1934 cells/uL (ref 850–3900)
MCH: 31.7 pg (ref 27.0–33.0)
MCHC: 35.7 g/dL (ref 32.0–36.0)
MCV: 88.9 fL (ref 80.0–100.0)
MPV: 9.1 fL (ref 7.5–12.5)
Monocytes Relative: 9.9 %
Neutro Abs: 3373 cells/uL (ref 1500–7800)
Neutrophils Relative %: 54.4 %
Platelets: 194 10*3/uL (ref 140–400)
RBC: 5.23 10*6/uL (ref 4.20–5.80)
RDW: 13.1 % (ref 11.0–15.0)
Total Lymphocyte: 31.2 %
WBC mixed population: 614 cells/uL (ref 200–950)
WBC: 6.2 10*3/uL (ref 3.8–10.8)

## 2017-06-10 LAB — COMPLETE METABOLIC PANEL WITH GFR
AG Ratio: 2.4 (calc) (ref 1.0–2.5)
ALT: 25 U/L (ref 9–46)
AST: 19 U/L (ref 10–35)
Albumin: 4.3 g/dL (ref 3.6–5.1)
Alkaline phosphatase (APISO): 70 U/L (ref 40–115)
BUN: 19 mg/dL (ref 7–25)
CO2: 28 mmol/L (ref 20–32)
Calcium: 10 mg/dL (ref 8.6–10.3)
Chloride: 107 mmol/L (ref 98–110)
Creat: 1.11 mg/dL (ref 0.70–1.33)
GFR, Est African American: 86 mL/min/{1.73_m2} (ref >=60)
GFR, Est Non African American: 74 mL/min/{1.73_m2} (ref >=60)
Globulin: 1.8 g/dL (calc) — ABNORMAL LOW (ref 1.9–3.7)
Glucose, Bld: 106 mg/dL — ABNORMAL HIGH (ref 65–99)
Potassium: 4.1 mmol/L (ref 3.5–5.3)
Sodium: 141 mmol/L (ref 135–146)
Total Bilirubin: 0.4 mg/dL (ref 0.2–1.2)
Total Protein: 6.1 g/dL (ref 6.1–8.1)

## 2017-06-10 LAB — LIPID PANEL
Cholesterol: 137 mg/dL (ref <200)
HDL: 48 mg/dL (ref >40)
LDL Cholesterol (Calc): 76 mg/dL (calc)
Non-HDL Cholesterol (Calc): 89 mg/dL (calc) (ref <130)
Total CHOL/HDL Ratio: 2.9 (calc) (ref <5.0)
Triglycerides: 54 mg/dL (ref <150)

## 2017-06-11 ENCOUNTER — Ambulatory Visit: Payer: 59 | Admitting: Internal Medicine

## 2017-06-11 ENCOUNTER — Encounter: Payer: Self-pay | Admitting: Internal Medicine

## 2017-06-11 VITALS — BP 136/78 | HR 74 | Temp 97.8°F | Wt 211.0 lb

## 2017-06-11 DIAGNOSIS — R5382 Chronic fatigue, unspecified: Secondary | ICD-10-CM

## 2017-06-11 DIAGNOSIS — N401 Enlarged prostate with lower urinary tract symptoms: Secondary | ICD-10-CM

## 2017-06-11 DIAGNOSIS — F17200 Nicotine dependence, unspecified, uncomplicated: Secondary | ICD-10-CM | POA: Diagnosis not present

## 2017-06-11 DIAGNOSIS — E7800 Pure hypercholesterolemia, unspecified: Secondary | ICD-10-CM | POA: Diagnosis not present

## 2017-06-11 DIAGNOSIS — R3911 Hesitancy of micturition: Secondary | ICD-10-CM | POA: Diagnosis not present

## 2017-06-11 DIAGNOSIS — L679 Hair color and hair shaft abnormality, unspecified: Secondary | ICD-10-CM

## 2017-06-11 DIAGNOSIS — I10 Essential (primary) hypertension: Secondary | ICD-10-CM | POA: Diagnosis not present

## 2017-06-11 DIAGNOSIS — J449 Chronic obstructive pulmonary disease, unspecified: Secondary | ICD-10-CM

## 2017-06-11 DIAGNOSIS — Z716 Tobacco abuse counseling: Secondary | ICD-10-CM

## 2017-06-11 NOTE — Progress Notes (Signed)
Location:  [facility] clinic Provider:  Tiffany L. Mariea Clonts, D.O., C.M.D.  Code Status: Full Code Goals of Care:  Advanced Directives 06/11/2017  Does Patient Have a Medical Advance Directive? No  Would patient like information on creating a medical advance directive? No - Patient declined     Chief Complaint  Patient presents with  . Medical Management of Chronic Issues    61mth follow-up  . ACP    no ACP    HPI: Patient is a 57 y.o. male seen today for medical management of chronic diseases.  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 He is doing well with the medications he is on and has no reports of trouble affording them. He would like a refill on Flomax as he continue to have multiple trips to the restroom without it. He reports still smoking 10 cigarettes to a pack a day. He has been asked about Chantix, but this is to costly and they are having trouble currently with affording their specialist appointments.   Outside of these items he wants [place] to know his hair, beard and nails have greatly reduce in growing. He denies feeling cold. Denies constipation. Reports eating a balanced diet-no weight changes. Denies sexual dysfunction. Reports never having a full hair growth on his legs even as a teenager.    Past Medical History:  Diagnosis Date  . Chest pain, unspecified   . Chronic airway obstruction, not elsewhere classified   . Chronic kidney disease    renal and ureteral stones  . Depressive disorder, not elsewhere classified   . Dermatophytosis of the body   . Dyspepsia and other specified disorders of function of stomach   . Headache    frequently, daily   . History of kidney stones   . Obesity, unspecified   . Other abnormal blood chemistry   . Other and unspecified hyperlipidemia   . Other malaise and fatigue   . Overweight(278.02)   . Pain in limb   . Psychosexual dysfunction  with inhibited sexual excitement   . Tobacco use disorder   . Unspecified disorder of skin and subcutaneous tissue   . Unspecified essential hypertension   . Wheezing     Past Surgical History:  Procedure Laterality Date  . ANAL FISSURE REPAIR  1985  . CYSTOSCOPY WITH STENT PLACEMENT Right 04/24/2016   Procedure: CYSTOSCOPY WITH STENT PLACEMENT;  Surgeon: Franchot Gallo, MD;  Location: [facility];  Service: Urology;  Laterality: Right;  . CYSTOSCOPY/RETROGRADE/URETEROSCOPY/STONE EXTRACTION WITH BASKET Right 04/24/2016   Procedure: CYSTOSCOPY/RETROGRADE/URETEROSCOPY/STONE EXTRACTION WITH BASKETAND STENT PLACEMENT;  Surgeon: Franchot Gallo, MD;  Location: [facility];  Service: Urology;  Laterality: Right;  . HOLMIUM LASER APPLICATION Right 06/03/2540   Procedure: HOLMIUM LASER APPLICATION;  Surgeon: Franchot Gallo, MD;  Location: [facility];  Service: Urology;  Laterality: Right;  . right renal stone removed  1983   Dr Serita Butcher    Allergies  Allergen Reactions  . Zoloft [Sertraline Hcl] Other (See Comments)    Lost points in time while taking the medication so weaned himself off of it  . Codeine Itching and Swelling    Outpatient Encounter Medications as of 06/11/2017  Medication Sig  . amLODipine (NORVASC) 10 MG tablet TAKE ONE TABLET BY MOUTH ONCE DAILY FOR BLOOD PRESSURE  . aspirin 81 MG tablet Take 81 mg by mouth daily.   Marland Kitchen atorvastatin (LIPITOR) 80 MG tablet TAKE  ONE TABLET BY MOUTH ONCE DAILY AT 6 PM FOR CHOLESTEROL  . escitalopram (LEXAPRO) 5 MG tablet TAKE 1 TABLET BY MOUTH ONCE DAILY  . multivitamin (ONE-A-DAY MEN'S) TABS Take 1 tablet by mouth daily.   . Omega-3 Fatty Acids (FISH OIL) 1200 MG CAPS Take 1,200 mg by mouth daily.   . tamsulosin (FLOMAX) 0.4 MG CAPS capsule Take 1 capsule (0.4 mg total) by mouth daily.   No facility-administered encounter medications on file as of 06/11/2017.     Review of Systems:  Review of Systems  Constitutional: Positive for  malaise/fatigue. Negative for chills and fever.  Eyes:       Glasses-changes in vision needs new glasses  Respiratory: Negative for cough and shortness of breath.        Chronic cough due to smoking   Cardiovascular: Negative for chest pain, palpitations and leg swelling.  Gastrointestinal: Negative for blood in stool.  Genitourinary: Positive for urgency. Negative for hematuria.  Skin:       Reduced hair growth  Psychiatric/Behavioral: Negative for depression and memory loss. The patient is not nervous/anxious and does not have insomnia.     Health Maintenance  Topic Date Due  . Hepatitis C Screening  05-Mar-1961  . HIV Screening  09/18/1975  . TETANUS/TDAP  04/15/2019  . COLONOSCOPY  09/06/2024  . INFLUENZA VACCINE  Completed    Physical Exam: Vitals:   06/11/17 1104  BP: 136/78  Pulse: 74  Temp: 97.8 F (36.6 C)  TempSrc: Oral  SpO2: 96%  Weight: 211 lb (95.7 kg)   Body mass index is 29.43 kg/m. Physical Exam  Constitutional: He is oriented to person, place, and time. He appears well-developed and well-nourished.  HENT:  Head: Normocephalic.  Cardiovascular: Normal rate, regular rhythm, normal heart sounds and intact distal pulses.  Pulmonary/Chest: Effort normal and breath sounds normal.  Abdominal: Soft. Bowel sounds are normal.  Musculoskeletal: Normal range of motion.  Neurological: He is alert and oriented to person, place, and time.  Skin: Skin is warm, dry and intact. No rash noted. No cyanosis. No pallor. Nails show no clubbing.     Psychiatric: He has a normal mood and affect. His behavior is normal. Judgment and thought content normal.  Vitals reviewed.   Labs reviewed: Basic Metabolic Panel: Recent Labs    07/01/16 0806 09/26/16 1220 06/09/17 1057  NA 141 141 141  K 4.1 3.7 4.1  CL 108 109 107  CO2 27 22 28   GLUCOSE 99 88 106*  BUN 24 18 19   CREATININE 1.04 1.06 1.11  CALCIUM 9.7 10.0 10.0   Liver Function Tests: Recent Labs     07/01/16 0806 09/26/16 1220 06/09/17 1057  AST 19 16 19   ALT 29 21 25   ALKPHOS 60 73  --   BILITOT 0.3 0.4 0.4  PROT 6.0* 5.7* 6.1  ALBUMIN 4.3 4.0  --    No results for input(s): LIPASE, AMYLASE in the last 8760 hours. No results for input(s): AMMONIA in the last 8760 hours. CBC: Recent Labs    07/01/16 0806 09/26/16 1220 06/09/17 1057  WBC 6.3 6.7 6.2  NEUTROABS 3,087 4,020 3,373  HGB 15.8 16.0 16.6  HCT 45.8 45.7 46.5  MCV 90.5 89.4 88.9  PLT 180 183 194   Lipid Panel: Recent Labs    07/01/16 0806 06/09/17 1057  CHOL 148 137  HDL 59 48  LDLCALC 80  --   TRIG 44 54  CHOLHDL 2.5 2.9   Lab Results  Component Value Date   HGBA1C 5.1 06/09/2017    Procedures since last visit: No results found.  Assessment/Plan  1. Tobacco use disorder Continues to smoke, declines Chantix due to cost. Nothing else has really helped him stopped in the past.   2. Tobacco abuse counseling Provided 5 minutes of counseling about the need to stop smoking due to health issues.   3. Essential hypertension, benign Today in office BP is stable. Continue current regimen at this time.  4. Pure hypercholesterolemia Tolerating statin therapy, diet is not the best per him. Will continue Lipitor. Encouraged to eat a more balanced diet.   5. Chronic fatigue He reports on going fatigue, his TSH in 2015 was normal, but he still suffers from the feeling of being tired, this could be related to Thyroid changes or shift work. Will assess labs.   - TSH - Testosterone  6. Benign prostatic hyperplasia with urinary hesitancy He continues to require the Flomax for nocturia. He tolerates it well. Will continue, provided refill today.   7. Chronic obstructive pulmonary disease, unspecified COPD type ([place]) He is not on treatment for his. He continues to smoke. He does not have signs of uncontrolled/acute COPD at this time. Continue to work on smoking cessation.   8. Disorder of hair He has  noted reduction in hair growth and nail growth. This is concerning for testosterone or thyroid changes. Will assess labs for a possible cause.   - TSH - Testosterone  Labs/tests ordered:  Orders Placed This Encounter  Procedures  . TSH  . Testosterone    Next appt:  10/01/2017   Karen Kays, RN, DNP [facility] [phone], [place] [address] Cell Phone (Mon-Fri 8am-5pm):  [phone] On Call:  [phone] & follow prompts after 5pm & weekends Office Phone:  [phone] Office Fax:  [phone]

## 2017-06-12 ENCOUNTER — Other Ambulatory Visit: Payer: Self-pay | Admitting: *Deleted

## 2017-06-12 LAB — TESTOSTERONE: Testosterone: 569 ng/dL (ref 250–827)

## 2017-06-12 LAB — TSH: TSH: 0.72 mIU/L (ref 0.40–4.50)

## 2017-06-12 MED ORDER — TAMSULOSIN HCL 0.4 MG PO CAPS: 0.4000 mg | ORAL_CAPSULE | Freq: Every day | ORAL | 1 refills | Status: DC

## 2017-06-12 NOTE — Telephone Encounter (Signed)
Patient wife called and stated that Dr. Mariea Clonts was going to send in Rx for patient's Tamsulosin but pharmacy hasn't received. Reviewed last OV note. Faxed Rx.

## 2017-10-01 ENCOUNTER — Ambulatory Visit: Payer: 59 | Admitting: Internal Medicine

## 2017-10-08 ENCOUNTER — Other Ambulatory Visit: Payer: Self-pay | Admitting: Internal Medicine

## 2017-10-12 ENCOUNTER — Ambulatory Visit: Payer: 59 | Admitting: Internal Medicine

## 2017-10-12 ENCOUNTER — Encounter: Payer: Self-pay | Admitting: Internal Medicine

## 2017-10-12 VITALS — BP 128/78 | HR 81 | Temp 98.1°F | Ht 71.0 in | Wt 208.0 lb

## 2017-10-12 DIAGNOSIS — Z6829 Body mass index (BMI) 29.0-29.9, adult: Secondary | ICD-10-CM | POA: Diagnosis not present

## 2017-10-12 DIAGNOSIS — E663 Overweight: Secondary | ICD-10-CM

## 2017-10-12 DIAGNOSIS — M25552 Pain in left hip: Secondary | ICD-10-CM | POA: Diagnosis not present

## 2017-10-12 DIAGNOSIS — F321 Major depressive disorder, single episode, moderate: Secondary | ICD-10-CM

## 2017-10-12 DIAGNOSIS — D126 Benign neoplasm of colon, unspecified: Secondary | ICD-10-CM | POA: Diagnosis not present

## 2017-10-12 DIAGNOSIS — F17200 Nicotine dependence, unspecified, uncomplicated: Secondary | ICD-10-CM

## 2017-10-12 DIAGNOSIS — I10 Essential (primary) hypertension: Secondary | ICD-10-CM

## 2017-10-12 MED ORDER — ESCITALOPRAM OXALATE 5 MG PO TABS: 5.0000 mg | ORAL_TABLET | Freq: Every day | ORAL | 0 refills | Status: DC

## 2017-10-12 MED ORDER — TAMSULOSIN HCL 0.4 MG PO CAPS: 0.4000 mg | ORAL_CAPSULE | Freq: Every day | ORAL | 1 refills | Status: DC

## 2017-10-12 NOTE — Progress Notes (Signed)
Location:  [facility] clinic Provider:  Hadar Elgersma L. Mariea Clonts, D.O., C.M.D.  Goals of Care:  Advanced Directives 06/11/2017  Does Patient Have a Medical Advance Directive? No  Would patient like information on creating a medical advance directive? No - Patient declined   Chief Complaint  Patient presents with  . Medical Management of Chronic Issues    71mth follow-up    HPI: Patient is a 57 y.o. male seen today for medical management of chronic diseases.    Lost sensation to left side of his foot.  Left hip pain.  He tries to get up and all his muscles feel tight.  He notices the pain in the left hip after using the push mower.  Takes two to three days to get over it.  It's been there for years, but getting bad now.  Stubbed toe straight into a brick and barely felt it.  Came back in the medial part of foot but lateral part is tingling.  Denies pain in his back.  Hurts right over the hip joint laterally.  Steady ache won't quit.  He's taken some excedrins.  Wondered about seat in his truck.    She was limping and his daughter noticed.    Says mood is ok these days.  Sometimes he doesn't want to get up and go to work, but "ain't that everybody?"  Smoking a 1/2 ppd when he's working.  Does not smoke at the house.  Happens when he gets agitated.    chantix cost was prohibitive.  He's done patches.  It slows him down, but he still gets the urges.  Is trying to back off.  He also wants them more if he's tired.  He also can't break the association with the morning coffee.      Office Visit from 10/12/2017 in [place]  AUDIT-C Score  2     Past Medical History:  Diagnosis Date  . Chest pain, unspecified   . Chronic airway obstruction, not elsewhere classified   . Chronic kidney disease    renal and ureteral stones  . Depressive disorder, not elsewhere classified   . Dermatophytosis of the body   . Dyspepsia and other specified disorders of function of stomach   . Headache    frequently,  daily   . History of kidney stones   . Obesity, unspecified   . Other abnormal blood chemistry   . Other and unspecified hyperlipidemia   . Other malaise and fatigue   . Overweight(278.02)   . Pain in limb   . Psychosexual dysfunction with inhibited sexual excitement   . Tobacco use disorder   . Unspecified disorder of skin and subcutaneous tissue   . Unspecified essential hypertension   . Wheezing     Past Surgical History:  Procedure Laterality Date  . ANAL FISSURE REPAIR  1985  . CYSTOSCOPY WITH STENT PLACEMENT Right 04/24/2016   Procedure: CYSTOSCOPY WITH STENT PLACEMENT;  Surgeon: Franchot Gallo, MD;  Location: [facility];  Service: Urology;  Laterality: Right;  . CYSTOSCOPY/RETROGRADE/URETEROSCOPY/STONE EXTRACTION WITH BASKET Right 04/24/2016   Procedure: CYSTOSCOPY/RETROGRADE/URETEROSCOPY/STONE EXTRACTION WITH BASKETAND STENT PLACEMENT;  Surgeon: Franchot Gallo, MD;  Location: [facility];  Service: Urology;  Laterality: Right;  . HOLMIUM LASER APPLICATION Right 5/36/1443   Procedure: HOLMIUM LASER APPLICATION;  Surgeon: Franchot Gallo, MD;  Location: [facility];  Service: Urology;  Laterality: Right;  . right renal stone removed  1983   Dr Serita Butcher    Allergies  Allergen Reactions  .  Zoloft [Sertraline Hcl] Other (See Comments)    Lost points in time while taking the medication so weaned himself off of it  . Codeine Itching and Swelling    Outpatient Encounter Medications as of 10/12/2017  Medication Sig  . amLODipine (NORVASC) 10 MG tablet TAKE 1 TABLET BY MOUTH ONCE DAILY FOR BLOOD PRESSURE  . aspirin 81 MG tablet Take 81 mg by mouth daily.   Marland Kitchen atorvastatin (LIPITOR) 80 MG tablet TAKE 1 TABLET BY MOUTH ONCE DAILY AT  6PM  FOR  CHOLESTROL  . escitalopram (LEXAPRO) 5 MG tablet TAKE 1 TABLET BY MOUTH ONCE DAILY  . multivitamin (ONE-A-DAY MEN'S) TABS Take 1 tablet by mouth daily.   . Omega-3 Fatty Acids (FISH OIL) 1200 MG CAPS Take 1,200 mg by mouth daily.   . tamsulosin  (FLOMAX) 0.4 MG CAPS capsule Take 1 capsule (0.4 mg total) by mouth daily.   No facility-administered encounter medications on file as of 10/12/2017.     Review of Systems:  Review of Systems  Constitutional: Negative for chills, fever and malaise/fatigue.  HENT: Negative for congestion.   Eyes: Negative for blurred vision.  Respiratory: Negative for shortness of breath.   Cardiovascular: Negative for chest pain, palpitations and leg swelling.  Gastrointestinal: Negative for abdominal pain, blood in stool, constipation and melena.  Genitourinary: Negative for dysuria.  Musculoskeletal: Positive for joint pain. Negative for back pain, falls, myalgias and neck pain.  Neurological: Negative for dizziness.  Endo/Heme/Allergies: Does not bruise/bleed easily.  Psychiatric/Behavioral: Negative for depression and memory loss. The patient is not nervous/anxious and does not have insomnia.     Health Maintenance  Topic Date Due  . Hepatitis C Screening  October 06, 1960  . HIV Screening  09/18/1975  . INFLUENZA VACCINE  11/12/2017  . TETANUS/TDAP  04/15/2019  . COLONOSCOPY  09/06/2024    Physical Exam: Vitals:   10/12/17 0900  BP: 128/78  Pulse: 81  Temp: 98.1 F (36.7 C)  TempSrc: Oral  SpO2: 96%  Weight: 208 lb (94.3 kg)  Height: 5\' 11"  (1.803 m)   Body mass index is 29.01 kg/m. Physical Exam  Constitutional: He is oriented to person, place, and time. He appears well-developed and well-nourished. No distress.  Cardiovascular: Normal rate, regular rhythm, normal heart sounds and intact distal pulses.  Pulmonary/Chest: Effort normal. He has wheezes.  Right base  Abdominal: Bowel sounds are normal.  Musculoskeletal: He exhibits tenderness.  Left hip tender over joint and he jumped with pain with lateral rotation of left hip  Neurological: He is alert and oriented to person, place, and time. He displays normal reflexes. A sensory deficit is present. No cranial nerve deficit. He  exhibits normal muscle tone. Coordination normal.  Left lateral foot and toes with decreased sensation to monofilament  Skin: Skin is warm and dry.  Psychiatric: He has a normal mood and affect.    Labs reviewed: Basic Metabolic Panel: Recent Labs    06/09/17 1057 06/11/17 1225  NA 141  --   K 4.1  --   CL 107  --   CO2 28  --   GLUCOSE 106*  --   BUN 19  --   CREATININE 1.11  --   CALCIUM 10.0  --   TSH  --  0.72   Liver Function Tests: Recent Labs    06/09/17 1057  AST 19  ALT 25  BILITOT 0.4  PROT 6.1   No results for input(s): LIPASE, AMYLASE in the last 8760 hours.  No results for input(s): AMMONIA in the last 8760 hours. CBC: Recent Labs    06/09/17 1057  WBC 6.2  NEUTROABS 3,373  HGB 16.6  HCT 46.5  MCV 88.9  PLT 194   Lipid Panel: Recent Labs    06/09/17 1057  CHOL 137  HDL 48  LDLCALC 76  TRIG 54  CHOLHDL 2.9   Lab Results  Component Value Date   HGBA1C 5.1 06/09/2017   Assessment/Plan 1. Overweight with body mass index (BMI) 25.0-29.9 -previously was underweight, now slightly overweight again -provided diet info  2. Body mass index 29.0-29.9, adult -diet info provided  3. Adenomatous polyp of colon, unspecified part of colon - due for q 3 yr cscope with Dr. Mayra Neer letter--updated health maintenance schedule - Ambulatory referral to Gastroenterology  4. Left hip pain - severe after exercise -?bursitis vs advanced arthritis with bone spur compressing nerve with left foot numbness - DG HIP UNILAT WITH PELVIS MIN 4 VIEWS LEFT; Future  5. Tobacco use disorder -ongoing, not ready to quit altogether  6. Essential hypertension, benign -bp at goal, no changes  Labs/tests ordered:   Orders Placed This Encounter  Procedures  . DG HIP UNILAT WITH PELVIS MIN 4 VIEWS LEFT    Standing Status:   Future    Standing Expiration Date:   12/14/2018    Order Specific Question:   Reason for Exam (SYMPTOM  OR DIAGNOSIS REQUIRED)     Answer:   left hip tenderness, numbness of lateral foot and leg    Order Specific Question:   Preferred imaging location?    Answer:   [id number]    Order Specific Question:   Radiology Contrast Protocol - do NOT remove file path    Answer:   [email or web address]  . Ambulatory referral to Gastroenterology    Referral Priority:   Routine    Referral Type:   Consultation    Referral Reason:   Specialty Services Required    Number of Visits Requested:   1    Next appt:  02/25/2018 med mgt, but return prn re: hip   Nakeia Calvi L. Storey Stangeland, D.O. [facility] [address], [place] [address] Cell Phone (Mon-Fri 8am-5pm):  [phone] On Call:  [phone] & follow prompts after 5pm & weekends Office Phone:  [phone] Office Fax:  [phone]

## 2017-10-12 NOTE — Patient Instructions (Signed)
Fat and Cholesterol Restricted Diet Getting too much fat and cholesterol in your diet may cause health problems. Following this diet helps keep your fat and cholesterol at normal levels. This can keep you from getting sick. What types of fat should I choose?  Choose monosaturated and polyunsaturated fats. These are found in foods such as olive oil, canola oil, flaxseeds, walnuts, almonds, and seeds.  Eat more omega-3 fats. Good choices include salmon, mackerel, sardines, tuna, flaxseed oil, and ground flaxseeds.  Limit saturated fats. These are in animal products such as meats, butter, and cream. They can also be in plant products such as palm oil, palm kernel oil, and coconut oil.  Avoid foods with partially hydrogenated oils in them. These contain trans fats. Examples of foods that have trans fats are stick margarine, some tub margarines, cookies, crackers, and other baked goods. What general guidelines do I need to follow?  Check food labels. Look for the words "trans fat" and "saturated fat."  When preparing a meal: ? Fill half of your plate with vegetables and green salads. ? Fill one fourth of your plate with whole grains. Look for the word "whole" as the first word in the ingredient list. ? Fill one fourth of your plate with lean protein foods.  Eat more foods that have fiber, like apples, carrots, beans, peas, and barley.  Eat more home-cooked foods. Eat less at restaurants and buffets.  Limit or avoid alcohol.  Limit foods high in starch and sugar.  Limit fried foods.  Cook foods without frying them. Baking, boiling, grilling, and broiling are all great options.  Lose weight if you are overweight. Losing even a small amount of weight can help your overall health. It can also help prevent diseases such as diabetes and heart disease. What foods can I eat? Grains Whole grains, such as whole wheat or whole grain breads, crackers, cereals, and pasta. Unsweetened oatmeal,  bulgur, barley, quinoa, or brown rice. Corn or whole wheat flour tortillas. Vegetables Fresh or frozen vegetables (raw, steamed, roasted, or grilled). Green salads. Fruits All fresh, canned (in natural juice), or frozen fruits. Meat and Other Protein Products Ground beef (85% or leaner), grass-fed beef, or beef trimmed of fat. Skinless chicken or turkey. Ground chicken or turkey. Pork trimmed of fat. All fish and seafood. Eggs. Dried beans, peas, or lentils. Unsalted nuts or seeds. Unsalted canned or dry beans. Dairy Low-fat dairy products, such as skim or 1% milk, 2% or reduced-fat cheeses, low-fat ricotta or cottage cheese, or plain low-fat yogurt. Fats and Oils Tub margarines without trans fats. Light or reduced-fat mayonnaise and salad dressings. Avocado. Olive, canola, sesame, or safflower oils. Natural peanut or almond butter (choose ones without added sugar and oil). The items listed above may not be a complete list of recommended foods or beverages. Contact your dietitian for more options. What foods are not recommended? Grains White bread. White pasta. White rice. Cornbread. Bagels, pastries, and croissants. Crackers that contain trans fat. Vegetables White potatoes. Corn. Creamed or fried vegetables. Vegetables in a cheese sauce. Fruits Dried fruits. Canned fruit in light or heavy syrup. Fruit juice. Meat and Other Protein Products Fatty cuts of meat. Ribs, chicken wings, bacon, sausage, bologna, salami, chitterlings, fatback, hot dogs, bratwurst, and packaged luncheon meats. Liver and organ meats. Dairy Whole or 2% milk, cream, half-and-half, and cream cheese. Whole milk cheeses. Whole-fat or sweetened yogurt. Full-fat cheeses. Nondairy creamers and whipped toppings. Processed cheese, cheese spreads, or cheese curds. Sweets and Desserts Corn   syrup, sugars, honey, and molasses. Candy. Jam and jelly. Syrup. Sweetened cereals. Cookies, pies, cakes, donuts, muffins, and ice  cream. Fats and Oils Butter, stick margarine, lard, shortening, ghee, or bacon fat. Coconut, palm kernel, or palm oils. Beverages Alcohol. Sweetened drinks (such as sodas, lemonade, and fruit drinks or punches). The items listed above may not be a complete list of foods and beverages to avoid. Contact your dietitian for more information. This information is not intended to replace advice given to you by your health care provider. Make sure you discuss any questions you have with your health care provider. Document Released: 09/30/2011 Document Revised: 12/06/2015 Document Reviewed: 06/30/2013 Elsevier Interactive Patient Education  2018 [employer]  

## 2017-10-16 ENCOUNTER — Other Ambulatory Visit: Payer: Self-pay | Admitting: Internal Medicine

## 2017-10-16 ENCOUNTER — Ambulatory Visit
Admission: RE | Admit: 2017-10-16 | Discharge: 2017-10-16 | Disposition: A | Discharge status: Home / Self Care | Payer: 59 | Source: Ambulatory Visit | Attending: Internal Medicine | Admitting: Internal Medicine

## 2017-10-16 DIAGNOSIS — M25552 Pain in left hip: Secondary | ICD-10-CM

## 2017-10-21 DIAGNOSIS — Z1211 Encounter for screening for malignant neoplasm of colon: Secondary | ICD-10-CM | POA: Diagnosis not present

## 2017-11-12 DIAGNOSIS — K621 Rectal polyp: Secondary | ICD-10-CM | POA: Diagnosis not present

## 2017-11-12 DIAGNOSIS — D120 Benign neoplasm of cecum: Secondary | ICD-10-CM | POA: Diagnosis not present

## 2017-11-12 DIAGNOSIS — K635 Polyp of colon: Secondary | ICD-10-CM | POA: Diagnosis not present

## 2017-11-12 DIAGNOSIS — Z1211 Encounter for screening for malignant neoplasm of colon: Secondary | ICD-10-CM | POA: Diagnosis not present

## 2017-11-12 DIAGNOSIS — D123 Benign neoplasm of transverse colon: Secondary | ICD-10-CM | POA: Diagnosis not present

## 2017-11-12 DIAGNOSIS — D122 Benign neoplasm of ascending colon: Secondary | ICD-10-CM | POA: Diagnosis not present

## 2017-11-12 LAB — HM COLONOSCOPY

## 2017-11-13 ENCOUNTER — Encounter: Payer: Self-pay | Admitting: *Deleted

## 2018-01-18 ENCOUNTER — Other Ambulatory Visit: Payer: Self-pay | Admitting: Internal Medicine

## 2018-02-25 ENCOUNTER — Ambulatory Visit: Payer: 59 | Admitting: Internal Medicine

## 2018-02-25 ENCOUNTER — Encounter: Payer: Self-pay | Admitting: Internal Medicine

## 2018-02-25 VITALS — BP 132/70 | HR 87 | Temp 97.8°F | Resp 10 | Ht 71.0 in | Wt 207.0 lb

## 2018-02-25 DIAGNOSIS — J449 Chronic obstructive pulmonary disease, unspecified: Secondary | ICD-10-CM

## 2018-02-25 DIAGNOSIS — I10 Essential (primary) hypertension: Secondary | ICD-10-CM | POA: Diagnosis not present

## 2018-02-25 DIAGNOSIS — R011 Cardiac murmur, unspecified: Secondary | ICD-10-CM

## 2018-02-25 DIAGNOSIS — N401 Enlarged prostate with lower urinary tract symptoms: Secondary | ICD-10-CM

## 2018-02-25 DIAGNOSIS — R5382 Chronic fatigue, unspecified: Secondary | ICD-10-CM

## 2018-02-25 DIAGNOSIS — E7800 Pure hypercholesterolemia, unspecified: Secondary | ICD-10-CM

## 2018-02-25 DIAGNOSIS — R3911 Hesitancy of micturition: Secondary | ICD-10-CM

## 2018-02-25 DIAGNOSIS — Z23 Encounter for immunization: Secondary | ICD-10-CM | POA: Diagnosis not present

## 2018-02-25 DIAGNOSIS — F17200 Nicotine dependence, unspecified, uncomplicated: Secondary | ICD-10-CM | POA: Diagnosis not present

## 2018-02-25 DIAGNOSIS — R9431 Abnormal electrocardiogram [ECG] [EKG]: Secondary | ICD-10-CM

## 2018-02-25 NOTE — Progress Notes (Signed)
Location:  [facility] clinic Provider:  Keylin Ferryman L. Mariea Clonts, D.O., C.M.D.  Code Status: Full code Goals of Care:  Advanced Directives 06/11/2017  Does Patient Have a Medical Advance Directive? No  Would patient like information on creating a medical advance directive? No - Patient declined   Chief Complaint  Patient presents with  . Medical Management of Chronic Issues    4 month follow-up, discuss concerns with DOT physical (had elsewhere)   . Health Maintenance    Discuss Hep C and HIV recommendations  . Immunizations    Flu vaccine     HPI: Patient is a 57 y.o. male seen today for medical management of chronic diseases.    Hep C and HIV were potentially not covered by his insurance so he refused them previously.  Had DOT physical with [facility].  Says "she heard that his heart sounded funny:"  He'd had a bad chest cold just before that.  Still smoking 1/2 ppd.  Can't get it lower to save his butt."  He has no chest pain, shortness of breath, weakness or syncope.  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  He does report some chronic fatigue.    Had poor vision on testing there and got new glasses to 20/20.  He's had a COPD exacerbation which is gradually clearing up.  Still wheezing more than baseline.  Not on inhalers due to cost.    No reports of changes in urination.  Flu shot due.    Past Medical History:  Diagnosis Date  . Chest pain, unspecified   . Chronic airway obstruction, not elsewhere classified   . Chronic kidney disease    renal and ureteral stones  . Depressive disorder, not elsewhere classified   . Dermatophytosis of the body   . Dyspepsia and other specified disorders of function of stomach   . Headache    frequently, daily   . History of kidney stones   . Obesity, unspecified   . Other abnormal blood chemistry   . Other and unspecified hyperlipidemia   . Other malaise and fatigue   . Overweight(278.02)   . Pain in limb   .  Psychosexual dysfunction with inhibited sexual excitement   . Tobacco use disorder   . Unspecified disorder of skin and subcutaneous tissue   . Unspecified essential hypertension   . Wheezing     Past Surgical History:  Procedure Laterality Date  . ANAL FISSURE REPAIR  1985  . CYSTOSCOPY WITH STENT PLACEMENT Right 04/24/2016   Procedure: CYSTOSCOPY WITH STENT PLACEMENT;  Surgeon: Franchot Gallo, MD;  Location: [facility];  Service: Urology;  Laterality: Right;  . CYSTOSCOPY/RETROGRADE/URETEROSCOPY/STONE EXTRACTION WITH BASKET Right 04/24/2016   Procedure: CYSTOSCOPY/RETROGRADE/URETEROSCOPY/STONE EXTRACTION WITH BASKETAND STENT PLACEMENT;  Surgeon: Franchot Gallo, MD;  Location: [facility];  Service: Urology;  Laterality: Right;  . HOLMIUM LASER APPLICATION Right 05/10/5168   Procedure: HOLMIUM LASER APPLICATION;  Surgeon: Franchot Gallo, MD;  Location: [facility];  Service: Urology;  Laterality: Right;  . right renal stone removed  1983   Dr Serita Butcher    Allergies  Allergen Reactions  . Zoloft [Sertraline Hcl] Other (See Comments)    Lost points in time while taking the medication so weaned himself off of it  . Codeine Itching and Swelling    Outpatient Encounter Medications as of 02/25/2018  Medication Sig  . amLODipine (NORVASC) 10 MG tablet TAKE 1 TABLET BY MOUTH ONCE DAILY FOR BLOOD PRESSURE  . aspirin 81  MG tablet Take 81 mg by mouth daily.   Marland Kitchen atorvastatin (LIPITOR) 80 MG tablet TAKE 1 TABLET BY MOUTH ONCE DAILY AT  6PM  FOR  CHOLESTEROL  . multivitamin (ONE-A-DAY MEN'S) TABS Take 1 tablet by mouth daily.   . Omega-3 Fatty Acids (FISH OIL) 1200 MG CAPS Take 1,200 mg by mouth daily.   . tamsulosin (FLOMAX) 0.4 MG CAPS capsule Take 1 capsule (0.4 mg total) by mouth daily.  . [DISCONTINUED] escitalopram (LEXAPRO) 5 MG tablet Take 1 tablet (5 mg total) by mouth daily. (Patient not taking: Reported on 02/25/2018)   No facility-administered encounter medications on file as of  02/25/2018.     Review of Systems:  Review of Systems  Constitutional: Positive for malaise/fatigue. Negative for chills and fever.  HENT: Negative for congestion and hearing loss.   Eyes:       New glasses  Respiratory: Positive for cough, sputum production and wheezing. Negative for shortness of breath.   Cardiovascular: Negative for chest pain, palpitations and leg swelling.  Gastrointestinal: Negative for abdominal pain, blood in stool, constipation, diarrhea and melena.  Genitourinary: Negative for dysuria.  Musculoskeletal: Negative for falls and joint pain.  Skin: Negative for itching and rash.  Neurological: Negative for dizziness and loss of consciousness.  Endo/Heme/Allergies: Does not bruise/bleed easily.  Psychiatric/Behavioral: Negative for depression and memory loss. The patient is not nervous/anxious and does not have insomnia.     Health Maintenance  Topic Date Due  . Hepatitis C Screening  May 08, 1960  . HIV Screening  09/18/1975  . INFLUENZA VACCINE  11/12/2017  . TETANUS/TDAP  04/15/2019  . COLONOSCOPY  11/12/2020    Physical Exam: Vitals:   02/25/18 0732  BP: 132/70  Pulse: 87  Resp: 10  Temp: 97.8 F (36.6 C)  TempSrc: Oral  SpO2: 95%  Weight: 207 lb (93.9 kg)  Height: 5\' 11"  (1.803 m)   Body mass index is 28.87 kg/m. Physical Exam  Constitutional: He is oriented to person, place, and time. He appears well-developed and well-nourished. No distress.  HENT:  Head: Normocephalic and atraumatic.  Eyes:  New glasses, can read diplomas in room now (apparently could not before)  Cardiovascular: Normal rate, regular rhythm and intact distal pulses.  Murmur heard. 2/6 systolic murmur audible anteriorly  Pulmonary/Chest: Effort normal. No stridor. No respiratory distress. He has wheezes. He has no rales.  Abdominal: Bowel sounds are normal.  Musculoskeletal: Normal range of motion.  Neurological: He is alert and oriented to person, place, and time.    Skin: Skin is warm and dry.    Labs reviewed: Basic Metabolic Panel: Recent Labs    06/09/17 1057 06/11/17 1225  NA 141  --   K 4.1  --   CL 107  --   CO2 28  --   GLUCOSE 106*  --   BUN 19  --   CREATININE 1.11  --   CALCIUM 10.0  --   TSH  --  0.72   Liver Function Tests: Recent Labs    06/09/17 1057  AST 19  ALT 25  BILITOT 0.4  PROT 6.1   No results for input(s): LIPASE, AMYLASE in the last 8760 hours. No results for input(s): AMMONIA in the last 8760 hours. CBC: Recent Labs    06/09/17 1057  WBC 6.2  NEUTROABS 3,373  HGB 16.6  HCT 46.5  MCV 88.9  PLT 194   Lipid Panel: Recent Labs    06/09/17 1057  CHOL 137  HDL 48  LDLCALC 76  TRIG 54  CHOLHDL 2.9   Lab Results  Component Value Date   HGBA1C 5.1 06/09/2017    Assessment/Plan 1. 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 Tobacco use disorder -ongoing, smoking 1/2ppd, again counseled about risks of ongoing smoking, pt has been trying to cut down, chantix has been too costly for him and patches have not been effective--5 mins were spent counseling on this topic again today  3. Essential hypertension, benign -bp is at goal, cont same regimen, monitor  4. Pure hypercholesterolemia -f/u flp today, on high dose lipitor therapy, does not exercise and does at times adjust diet, but not consistently  5. Chronic fatigue -r/o cardiac etiology with ekg, then echo due to new murmur  6. Chronic obstructive pulmonary disease, unspecified COPD type ([place]) - recent exacerbation slowly clearing up - Flu vaccine HIGH DOSE PF (Fluzone High dose)  7. Benign prostatic hyperplasia with urinary hesitancy -no changes in symptoms recently, has  done better with flomax use  8. Need for influenza vaccination - Flu vaccine HIGH DOSE PF (Fluzone High dose)  9.  Abnormal EKG--see number one  Labs/tests ordered:   Orders Placed This Encounter  Procedures  . Flu vaccine HIGH DOSE PF (Fluzone High dose)  . Lipid panel    Order Specific Question:   Has the patient fasted?    Answer:   Yes  . CBC with Differential/Platelet  . Basic metabolic panel    Order Specific Question:   Has the patient fasted?    Answer:   Yes  . EKG 12-Lead   Next appt: 6 weeks  Teniya Filter L. Benn Tarver, D.O. [facility] [address], [place] [address] Cell Phone (Mon-Fri 8am-5pm):  [phone] On Call:  [phone] & follow prompts after 5pm & weekends Office Phone:  [phone] Office Fax:  [phone]

## 2018-02-26 LAB — CBC WITH DIFFERENTIAL/PLATELET
Basophils Absolute: 29 cells/uL (ref 0–200)
Basophils Relative: 0.4 %
Eosinophils Absolute: 270 cells/uL (ref 15–500)
Eosinophils Relative: 3.7 %
HCT: 46.9 % (ref 38.5–50.0)
Hemoglobin: 16.4 g/dL (ref 13.2–17.1)
Lymphs Abs: 2227 cells/uL (ref 850–3900)
MCH: 31.4 pg (ref 27.0–33.0)
MCHC: 35 g/dL (ref 32.0–36.0)
MCV: 89.7 fL (ref 80.0–100.0)
MPV: 9.8 fL (ref 7.5–12.5)
Monocytes Relative: 8 %
Neutro Abs: 4190 cells/uL (ref 1500–7800)
Neutrophils Relative %: 57.4 %
Platelets: 210 10*3/uL (ref 140–400)
RBC: 5.23 10*6/uL (ref 4.20–5.80)
RDW: 13.2 % (ref 11.0–15.0)
Total Lymphocyte: 30.5 %
WBC mixed population: 584 cells/uL (ref 200–950)
WBC: 7.3 10*3/uL (ref 3.8–10.8)

## 2018-02-26 LAB — LIPID PANEL
Cholesterol: 151 mg/dL (ref <200)
HDL: 48 mg/dL (ref >40)
LDL Cholesterol (Calc): 74 mg/dL (calc)
Non-HDL Cholesterol (Calc): 103 mg/dL (calc) (ref <130)
Total CHOL/HDL Ratio: 3.1 (calc) (ref <5.0)
Triglycerides: 197 mg/dL — ABNORMAL HIGH (ref <150)

## 2018-02-26 LAB — BASIC METABOLIC PANEL
BUN: 19 mg/dL (ref 7–25)
CO2: 28 mmol/L (ref 20–32)
Calcium: 10 mg/dL (ref 8.6–10.3)
Chloride: 107 mmol/L (ref 98–110)
Creat: 1 mg/dL (ref 0.70–1.33)
Glucose, Bld: 88 mg/dL (ref 65–139)
Potassium: 4.1 mmol/L (ref 3.5–5.3)
Sodium: 142 mmol/L (ref 135–146)

## 2018-03-01 ENCOUNTER — Telehealth: Payer: Self-pay

## 2018-03-01 NOTE — Telephone Encounter (Signed)
I guess we need to revise the referral to the doctor of their choice.  I had discussed with him the referral to [facility] cardiology--they had Dr. Merrilee Jansky name spelled incorrectly and I could not find it when he was here.

## 2018-03-01 NOTE — Telephone Encounter (Signed)
Per Dr.Reed ok to wait until 03/23/18.   I called Mrs.Hall Busing and she verbalized Dr.Reed's response. I will send referral and pertinent information to Dr.Ajay as requested.

## 2018-03-01 NOTE — Telephone Encounter (Signed)
Patient's wife called stating she has yet to hear from cardiology referral. I advise Mrs.Nuno that referral order was placed last Thursday 02/25/18 (2 business days ago) and was marked as a routine referral (non-emergent). Patient will need to wait at least a week to hear from cardiology.  Mrs.Cuffee states she took it upon herself to schedule an appointment with antoher cardiologist (recommended by family member). The earliest patient could be seen is 03/23/18 @ 10:00 am by Dannial Monarch  Mrs.Mosco would like to know if Dr.Reed can contact that doctors office for patient to be seen sooner.  Please advise

## 2018-03-01 NOTE — Telephone Encounter (Signed)
Please advise if you plan to contact the requested doctor's office to request an earlier appointment than what patient was provided with  03/23/18   Awaiting response

## 2018-03-02 ENCOUNTER — Telehealth: Payer: Self-pay

## 2018-03-02 NOTE — Telephone Encounter (Signed)
FYI patient is already on atorvastatin 80 mg. So should patient stay on this or make a change, patient recent labs you had noted depending on patient heart workup his cholesterol medication may had to be changed.?Please advise

## 2018-03-02 NOTE — Telephone Encounter (Signed)
I was wanting him to have his cardiology evaluation first.  He should continue his current cholesterol medication until then and keep his December appt.  He should work on smoking cessation in the interim.

## 2018-03-03 NOTE — Telephone Encounter (Signed)
Call placed to [facility] and provided provider recommendations. Neoma Laming verbalized understanding

## 2018-04-22 ENCOUNTER — Ambulatory Visit: Payer: 59 | Admitting: Internal Medicine

## 2018-04-22 DIAGNOSIS — R0602 Shortness of breath: Secondary | ICD-10-CM | POA: Diagnosis not present

## 2018-04-22 DIAGNOSIS — F17210 Nicotine dependence, cigarettes, uncomplicated: Secondary | ICD-10-CM | POA: Diagnosis not present

## 2018-04-22 DIAGNOSIS — R072 Precordial pain: Secondary | ICD-10-CM | POA: Diagnosis not present

## 2018-04-23 DIAGNOSIS — R072 Precordial pain: Secondary | ICD-10-CM | POA: Diagnosis not present

## 2018-04-23 DIAGNOSIS — R0602 Shortness of breath: Secondary | ICD-10-CM | POA: Diagnosis not present

## 2018-04-27 ENCOUNTER — Other Ambulatory Visit: Payer: Self-pay | Admitting: Internal Medicine

## 2018-06-03 ENCOUNTER — Ambulatory Visit (INDEPENDENT_AMBULATORY_CARE_PROVIDER_SITE_OTHER): Payer: 59 | Admitting: Internal Medicine

## 2018-06-03 ENCOUNTER — Encounter: Payer: Self-pay | Admitting: Internal Medicine

## 2018-06-03 VITALS — BP 138/80 | HR 83 | Temp 98.1°F | Ht 71.0 in | Wt 215.0 lb

## 2018-06-03 DIAGNOSIS — J449 Chronic obstructive pulmonary disease, unspecified: Secondary | ICD-10-CM | POA: Diagnosis not present

## 2018-06-03 DIAGNOSIS — E7800 Pure hypercholesterolemia, unspecified: Secondary | ICD-10-CM

## 2018-06-03 DIAGNOSIS — F17200 Nicotine dependence, unspecified, uncomplicated: Secondary | ICD-10-CM

## 2018-06-03 DIAGNOSIS — E663 Overweight: Secondary | ICD-10-CM

## 2018-06-03 DIAGNOSIS — Z6829 Body mass index (BMI) 29.0-29.9, adult: Secondary | ICD-10-CM | POA: Diagnosis not present

## 2018-06-03 DIAGNOSIS — R9431 Abnormal electrocardiogram [ECG] [EKG]: Secondary | ICD-10-CM

## 2018-06-03 DIAGNOSIS — I10 Essential (primary) hypertension: Secondary | ICD-10-CM | POA: Diagnosis not present

## 2018-06-03 NOTE — Progress Notes (Signed)
Location:  [facility] clinic Provider:  Zanayah Shadowens L. Mariea Clonts, D.O., C.M.D.  Goals of Care:  Advanced Directives 06/11/2017  Does Patient Have a Medical Advance Directive? No  Would patient like information on creating a medical advance directive? No - Patient declined   Chief Complaint  Patient presents with  . Medical Management of Chronic Issues    38mth follow-up    HPI: Patient is a 57 y.o. male seen today for medical management of chronic diseases.    He wound up saying his warranty was running out and that's why he had the heart attack on the EKG.  Cardiology told him about his smoking habit also.  He was busy yesterday so only smoked 3-4 but typically smokes 1/2 ppd.  He is trying to quit now.  He is leaving more time b/w his cigarettes.  He notes he is breathing better.  He reports no med changes were made.  Follows up again in march--90 days from last visit.  He "overshot" his stress test.  No chest pain.  No palpitations.    Shoulder is giving him a fit.    He has a right leg circulation problem.  He's putting more weight on his left leg.  Had an [place].  He is getting another test done for that.  His feet stay hot.   BP at goal.    Breathing doing better.  Some wheezing, but cough will clear it up.    Past Medical History:  Diagnosis Date  . Chest pain, unspecified   . Chronic airway obstruction, not elsewhere classified   . Chronic kidney disease    renal and ureteral stones  . Depressive disorder, not elsewhere classified   . Dermatophytosis of the body   . Dyspepsia and other specified disorders of function of stomach   . Headache    frequently, daily   . History of kidney stones   . Obesity, unspecified   . Other abnormal blood chemistry   . Other and unspecified hyperlipidemia   . Other malaise and fatigue   . Overweight(278.02)   . Pain in limb   . Psychosexual dysfunction with inhibited sexual excitement   . Tobacco use disorder   . Unspecified disorder of skin and  subcutaneous tissue   . Unspecified essential hypertension   . Wheezing     Past Surgical History:  Procedure Laterality Date  . ANAL FISSURE REPAIR  1985  . CYSTOSCOPY WITH STENT PLACEMENT Right 04/24/2016   Procedure: CYSTOSCOPY WITH STENT PLACEMENT;  Surgeon: Franchot Gallo, MD;  Location: [facility];  Service: Urology;  Laterality: Right;  . CYSTOSCOPY/RETROGRADE/URETEROSCOPY/STONE EXTRACTION WITH BASKET Right 04/24/2016   Procedure: CYSTOSCOPY/RETROGRADE/URETEROSCOPY/STONE EXTRACTION WITH BASKETAND STENT PLACEMENT;  Surgeon: Franchot Gallo, MD;  Location: [facility];  Service: Urology;  Laterality: Right;  . HOLMIUM LASER APPLICATION Right 2/29/7989   Procedure: HOLMIUM LASER APPLICATION;  Surgeon: Franchot Gallo, MD;  Location: [facility];  Service: Urology;  Laterality: Right;  . right renal stone removed  1983   Dr Serita Butcher    Allergies  Allergen Reactions  . Zoloft [Sertraline Hcl] Other (See Comments)    Lost points in time while taking the medication so weaned himself off of it  . Codeine Itching and Swelling    Outpatient Encounter Medications as of 06/03/2018  Medication Sig  . amLODipine (NORVASC) 10 MG tablet TAKE 1 TABLET BY MOUTH ONCE DAILY FOR BLOOD PRESSURE  . aspirin 81 MG tablet Take 81 mg by mouth daily.   Marland Kitchen  atorvastatin (LIPITOR) 80 MG tablet TAKE 1 TABLET BY MOUTH ONCE DAILY AT 6PM FOR CHOLESTEROL  . multivitamin (ONE-A-DAY MEN'S) TABS Take 1 tablet by mouth daily.   . Omega-3 Fatty Acids (FISH OIL) 1200 MG CAPS Take 1,200 mg by mouth daily.   . tamsulosin (FLOMAX) 0.4 MG CAPS capsule Take 1 capsule (0.4 mg total) by mouth daily.   No facility-administered encounter medications on file as of 06/03/2018.     Review of Systems:  Review of Systems  Constitutional: Negative for chills, fever and malaise/fatigue.  HENT: Negative for congestion and hearing loss.   Eyes: Negative for blurred vision.  Respiratory: Negative for cough and shortness of breath.     Cardiovascular: Negative for chest pain, palpitations and leg swelling.  Gastrointestinal: Negative for abdominal pain, blood in stool, constipation, diarrhea and melena.  Genitourinary: Negative for dysuria.  Musculoskeletal: Positive for joint pain. Negative for falls and myalgias.       Left hip  Skin: Negative for itching and rash.  Neurological: Negative for dizziness and loss of consciousness.  Endo/Heme/Allergies: Does not bruise/bleed easily.  Psychiatric/Behavioral: Negative for depression and memory loss. The patient is not nervous/anxious and does not have insomnia.     Health Maintenance  Topic Date Due  . Hepatitis C Screening  02/26/2019 (Originally 05-17-1960)  . HIV Screening  02/26/2019 (Originally 09/18/1975)  . TETANUS/TDAP  04/15/2019  . COLONOSCOPY  11/12/2020  . INFLUENZA VACCINE  Completed    Physical Exam: Vitals:   06/03/18 0801  BP: 138/80  Pulse: 83  Temp: 98.1 F (36.7 C)  TempSrc: Oral  SpO2: 98%  Weight: 215 lb (97.5 kg)  Height: 5\' 11"  (1.803 m)   Body mass index is 29.99 kg/m. Physical Exam Constitutional:      Appearance: Normal appearance.  HENT:     Head: Normocephalic and atraumatic.  Cardiovascular:     Rate and Rhythm: Normal rate and regular rhythm.     Pulses: Normal pulses.     Heart sounds: Murmur present.  Pulmonary:     Effort: Pulmonary effort is normal.     Breath sounds: Wheezing present.  Abdominal:     General: Bowel sounds are normal.  Musculoskeletal: Normal range of motion.  Skin:    General: Skin is warm and dry.  Neurological:     General: No focal deficit present.     Mental Status: He is alert and oriented to person, place, and time.  Psychiatric:        Mood and Affect: Mood normal.     Labs reviewed: Basic Metabolic Panel: Recent Labs    06/09/17 1057 06/11/17 1225 02/25/18 0834  NA 141  --  142  K 4.1  --  4.1  CL 107  --  107  CO2 28  --  28  GLUCOSE 106*  --  88  BUN 19  --  19   CREATININE 1.11  --  1.00  CALCIUM 10.0  --  10.0  TSH  --  0.72  --    Liver Function Tests: Recent Labs    06/09/17 1057  AST 19  ALT 25  BILITOT 0.4  PROT 6.1   No results for input(s): LIPASE, AMYLASE in the last 8760 hours. No results for input(s): AMMONIA in the last 8760 hours. CBC: Recent Labs    06/09/17 1057 02/25/18 0834  WBC 6.2 7.3  NEUTROABS 3,373 4,190  HGB 16.6 16.4  HCT 46.5 46.9  MCV 88.9 89.7  PLT 194 210   Lipid Panel: Recent Labs    06/09/17 1057 02/25/18 0834  CHOL 137 151  HDL 48 48  LDLCALC 76 74  TRIG 54 197*  CHOLHDL 2.9 3.1   Lab Results  Component Value Date   HGBA1C 5.1 06/09/2017    Assessment/Plan 1. Tobacco use disorder -ongoing--he reports smoking a max of 1/2ppd recently -he does want to try patches again and will check which strength he has and call back if he has a question  2. Overweight with body mass index (BMI) 25.0-29.9 -ongoing, but did weigh considerably more several years ago -this is priority 2 of his goals at this point -we are working on smoking cessation as first priority  3. Body mass index 29.0-29.9, adult -same as #2, educated on BMI and diet counseling  4. Chronic obstructive pulmonary disease, unspecified COPD type ([place]) -cont to work on smoking cessation -at some point, he stopped his inhaler due to cost  5. Pure hypercholesterolemia -cont to work on low cholesterol and low fat diet, should exercise  6. Essential hypertension, benign -bp at goal, cont same regimen  7. Abnormal EKG -has seen Dr. Doylene Canard, but oddly notes are not in epic though he's listed as a [facility] physician and the stress test he had and [place] of his legs also not visible -CMA is going to request records to be sent again as should be scanned by now if outside of epic as apparently is the case  Labs/tests ordered:  Will do fasting labs at next visit--educated to eat 8-12 hrs before instead of his usual schedule  Next appt:   09/30/2018 with same day fasting labs  Rainee Sweatt L. Kavin Weckwerth, D.O. [facility] [address], [place] [address] Cell Phone (Mon-Fri 8am-5pm):  [phone] On Call:  [phone] & follow prompts after 5pm & weekends Office Phone:  [phone] Office Fax:  [phone]

## 2018-06-03 NOTE — Patient Instructions (Addendum)
Please cut down more on cigarettes.  You can try the patches again at the highest strength.    Fat and Cholesterol Restricted Eating Plan Getting too much fat and cholesterol in your diet may cause health problems. Choosing the right foods helps keep your fat and cholesterol at normal levels. This can keep you from getting certain diseases. What are tips for following this plan? Meal planning  At meals, divide your plate into four equal parts: ? Fill one-half of your plate with vegetables and green salads. ? Fill one-fourth of your plate with whole grains. ? Fill one-fourth of your plate with low-fat (lean) protein foods.  Eat fish that is high in omega-3 fats at least two times a week. This includes mackerel, tuna, sardines, and salmon.  Eat foods that are high in fiber, such as whole grains, beans, apples, broccoli, carrots, peas, and barley. General tips   Work with your doctor to lose weight if you need to.  Avoid: ? Foods with added sugar. ? Fried foods. ? Foods with partially hydrogenated oils.  Limit alcohol intake to no more than 1 drink a day for nonpregnant women and 2 drinks a day for men. One drink equals 12 oz of beer, 5 oz of wine, or 1 oz of hard liquor. Reading food labels  Check food labels for: ? Trans fats. ? Partially hydrogenated oils. ? Saturated fat (g) in each serving. ? Cholesterol (mg) in each serving. ? Fiber (g) in each serving.  Choose foods with healthy fats, such as: ? Monounsaturated fats. ? Polyunsaturated fats. ? Omega-3 fats.  Choose grain products that have whole grains. Look for the word "whole" as the first word in the ingredient list. Cooking  Cook foods using low-fat methods. These include baking, boiling, grilling, and broiling.  Eat more home-cooked foods. Eat at restaurants and buffets less often.  Avoid cooking using saturated fats, such as butter, cream, palm oil, palm kernel oil, and coconut oil. Recommended  foods  Fruits  All fresh, canned (in natural juice), or frozen fruits. Vegetables  Fresh or frozen vegetables (raw, steamed, roasted, or grilled). Green salads. Grains  Whole grains, such as whole wheat or whole grain breads, crackers, cereals, and pasta. Unsweetened oatmeal, bulgur, barley, quinoa, or brown rice. Corn or whole wheat flour tortillas. Meats and other protein foods  Ground beef (85% or leaner), grass-fed beef, or beef trimmed of fat. Skinless chicken or [place]. Ground chicken or [place]. Pork trimmed of fat. All fish and seafood. Egg whites. Dried beans, peas, or lentils. Unsalted nuts or seeds. Unsalted canned beans. Nut butters without added sugar or oil. Dairy  Low-fat or nonfat dairy products, such as skim or 1% milk, 2% or reduced-fat cheeses, low-fat and fat-free ricotta or cottage cheese, or plain low-fat and nonfat yogurt. Fats and oils  Tub margarine without trans fats. Light or reduced-fat mayonnaise and salad dressings. Avocado. Olive, canola, sesame, or safflower oils. The items listed above may not be a complete list of foods and beverages you can eat. Contact a dietitian for more information. Foods to avoid Fruits  Canned fruit in heavy syrup. Fruit in cream or butter sauce. Fried fruit. Vegetables  Vegetables cooked in cheese, cream, or butter sauce. Fried vegetables. Grains  White bread. White pasta. White rice. Cornbread. Bagels, pastries, and croissants. Crackers and snack foods that contain trans fat and hydrogenated oils. Meats and other protein foods  Fatty cuts of meat. Ribs, chicken wings, bacon, sausage, bologna, salami, chitterlings, fatback, hot  dogs, bratwurst, and packaged lunch meats. Liver and organ meats. Whole eggs and egg yolks. Chicken and [place] with skin. Fried meat. Dairy  Whole or 2% milk, cream, half-and-half, and cream cheese. Whole milk cheeses. Whole-fat or sweetened yogurt. Full-fat cheeses. Nondairy creamers and whipped  toppings. Processed cheese, cheese spreads, and cheese curds. Beverages  Alcohol. Sugar-sweetened drinks such as sodas, lemonade, and fruit drinks. Fats and oils  Butter, stick margarine, lard, shortening, ghee, or bacon fat. Coconut, palm kernel, and palm oils. Sweets and desserts  Corn syrup, sugars, honey, and molasses. Candy. Jam and jelly. Syrup. Sweetened cereals. Cookies, pies, cakes, donuts, muffins, and ice cream. The items listed above may not be a complete list of foods and beverages you should avoid. Contact a dietitian for more information. Summary  Choosing the right foods helps keep your fat and cholesterol at normal levels. This can keep you from getting certain diseases.  At meals, fill one-half of your plate with vegetables and green salads.  Eat high-fiber foods, like whole grains, beans, apples, carrots, peas, and barley.  Limit added sugar, saturated fats, alcohol, and fried foods. This information is not intended to replace advice given to you by your health care provider. Make sure you discuss any questions you have with your health care provider. Document Released: 09/30/2011 Document Revised: 12/02/2017 Document Reviewed: 12/16/2016 Elsevier Interactive Patient Education  2019 [employer].

## 2018-06-25 ENCOUNTER — Other Ambulatory Visit: Payer: Self-pay | Admitting: Internal Medicine

## 2018-07-05 ENCOUNTER — Ambulatory Visit: Payer: 59 | Admitting: Nurse Practitioner

## 2018-07-05 ENCOUNTER — Telehealth (INDEPENDENT_AMBULATORY_CARE_PROVIDER_SITE_OTHER): Payer: 59 | Admitting: Internal Medicine

## 2018-07-05 ENCOUNTER — Other Ambulatory Visit: Payer: Self-pay

## 2018-07-05 DIAGNOSIS — R109 Unspecified abdominal pain: Secondary | ICD-10-CM

## 2018-07-05 DIAGNOSIS — Z87442 Personal history of urinary calculi: Secondary | ICD-10-CM | POA: Diagnosis not present

## 2018-07-05 DIAGNOSIS — R350 Frequency of micturition: Secondary | ICD-10-CM

## 2018-07-05 NOTE — Patient Instructions (Signed)
Monitor for hematuria, dysuria, urgency, changes in appearance of urine or increased pain--call back if worsens. We will arrange an appt with Dr. Diona Fanti at urology in the interim.

## 2018-07-05 NOTE — Progress Notes (Signed)
This service is provided via telemedicine  Location of patient (ex: home, work):  home  Patient consents to a telephone visit:  yes  Location of the provider (ex: office, home):  office  Name of any referring provider:  Hollace Kinnier, DO  Names of all persons participating in the telemedicine service and their role in the encounter:  Edwin Dada, [place]  Time spent on call:  57min   Pt would like to discuss possible kidney stone.

## 2018-07-05 NOTE — Progress Notes (Signed)
Virtual Visit via Telephone Note  I connected with Tom Lane on 07/05/18 at  9:00 AM EDT by telephone and verified that I am speaking with the correct person using two identifiers.   I discussed the limitations, risks, security and privacy concerns of performing an evaluation and management service by telephone and the availability of in person appointments. I also discussed with the patient that there may be a patient responsible charge related to this service. The patient expressed understanding and agreed to proceed.   History of Present Illness:  Wonders if he's having kidney stone.  Started Saturday morning.  When he moves, it makes him hurt.  It's on his left side.  No hematuria.  No color change.  It was getting dark and going straight to the bottom of the toilet, not mixing with the water.  No nausea or vomiting.  Different than when he's had a kidney stone before.  No dysuria.  Has had increased frequency--goes every 10 minutes for the first hour.  No certain urgency.  No fever or chills that he's aware of.  It got better yesterday.  He was taking tylenol which only eased it up.  At its worst, he had to stop due to pain.  It hurt like he'd been punched.  Left side lower ribs.  He was yelling out when it hurt. Now just a slight pain at present.   Rolling over in bed was making it hurt.  Previously the moving of the stone would make him hurt.  Urine turned plum cloudy when he forgot to flush it down.  Observations/Objective: No fever.    Assessment and Plan: 1. Acute left flank pain -new onset -has previously had right kidney stone -now has symptoms on left side that were severe Saturday, but did improve -will refer him to Dr. Diona Fanti who previously treated him for the right sided stone -trying to avoid sending him multiple places to prevent contact with virus--urology has ultrasound on site and could collect urine sample -he will monitor for hematuria and any new symptoms like  hematuria, urgency, dysuria or worsened pain or if he sees anything in the toilet bowl  2. History of nephrolithiasis -on cystoscopy, right ureteroscopy and stone extraction with Dr. Diona Fanti in 04/2016   3. 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 The patient was provided an opportunity to ask questions and all were answered. The patient agreed with the plan and demonstrated an understanding of the instructions.   The patient was advised to call back or seek an in-person evaluation if the symptoms worsen or if the condition fails to improve as anticipated.  I provided 13 minutes of non-face-to-face time during this encounter.  Hollace Kinnier, DO  Geriatrics [facility] [phone], [place] [address] Cell Phone (Mon-Fri 8am-5pm):  [phone] On Call:  [phone] & follow prompts after 5pm & weekends Office Phone:  [phone] Office Fax:  [phone]

## 2018-07-08 ENCOUNTER — Ambulatory Visit: Payer: 59 | Admitting: Internal Medicine

## 2018-07-23 ENCOUNTER — Other Ambulatory Visit: Payer: Self-pay | Admitting: Internal Medicine

## 2018-09-28 ENCOUNTER — Other Ambulatory Visit: Payer: Self-pay | Admitting: Internal Medicine

## 2018-09-30 ENCOUNTER — Encounter: Payer: Self-pay | Admitting: Internal Medicine

## 2018-09-30 ENCOUNTER — Other Ambulatory Visit: Payer: Self-pay

## 2018-09-30 ENCOUNTER — Ambulatory Visit (INDEPENDENT_AMBULATORY_CARE_PROVIDER_SITE_OTHER): Payer: 59 | Admitting: Internal Medicine

## 2018-09-30 VITALS — BP 120/70 | HR 88 | Temp 97.7°F | Ht 71.0 in | Wt 203.0 lb

## 2018-09-30 DIAGNOSIS — I10 Essential (primary) hypertension: Secondary | ICD-10-CM

## 2018-09-30 DIAGNOSIS — R3911 Hesitancy of micturition: Secondary | ICD-10-CM

## 2018-09-30 DIAGNOSIS — F17200 Nicotine dependence, unspecified, uncomplicated: Secondary | ICD-10-CM

## 2018-09-30 DIAGNOSIS — Z87442 Personal history of urinary calculi: Secondary | ICD-10-CM

## 2018-09-30 DIAGNOSIS — E7800 Pure hypercholesterolemia, unspecified: Secondary | ICD-10-CM | POA: Diagnosis not present

## 2018-09-30 DIAGNOSIS — R739 Hyperglycemia, unspecified: Secondary | ICD-10-CM

## 2018-09-30 DIAGNOSIS — N401 Enlarged prostate with lower urinary tract symptoms: Secondary | ICD-10-CM

## 2018-09-30 DIAGNOSIS — J449 Chronic obstructive pulmonary disease, unspecified: Secondary | ICD-10-CM | POA: Diagnosis not present

## 2018-09-30 MED ORDER — ALBUTEROL SULFATE HFA 108 (90 BASE) MCG/ACT IN AERS: 2.0000 | INHALATION_SPRAY | Freq: Four times a day (QID) | RESPIRATORY_TRACT | 5 refills | Status: DC | PRN

## 2018-09-30 NOTE — Progress Notes (Signed)
Location:  [facility] clinic Provider:  Colette Dicamillo L. Mariea Clonts, D.O., C.M.D.  Goals of Care:  Advanced Directives 06/11/2017  Does Patient Have a Medical Advance Directive? No  Would patient like information on creating a medical advance directive? No - Patient declined   Chief Complaint  Patient presents with  . Medical Management of Chronic Issues    22mth follow-up    HPI: Patient is a 58 y.o. male seen today for medical management of chronic diseases.    Asks about easy bruising--he's on the baby asa now.  It's making the left arm worse than right.    Kidney stones passed on their own--two of them.  He wound up canceling his appt.  No urinary problems as long as takes his flomax.  Has to wait 10 mins if he doesn't take it.  BP at goal with amlodipine.    Still smoking:  He's not smoking more.  He's getting through 8-9 cigarettes per day (last 2-3 days).  Smokes in the truck.  He still sees it has an improvement with prior 2-3 packs per day.  Breathing a little slow when he wakes up.  He has not had any inhalers that he's used lately.  Has struggles with pollen.  He agrees to keep albuterol on hand.  No chest pain.    Weight down by his report--was 203 lbs at home.  He's not eaten since 4:30am yesterday.  His left arm will go to sleep.  It even goes to sleep if he has his arm straight down against his side.  It will bother him, wake him and he has to move it.  Has had C7 disc rupture years ago.  They showed him videos to fix his disc and he decided there was no way he was having that.  Also has the hand to fall asleep when he's driving with his arm on the window and hand on the steering wheel.    Past Medical History:  Diagnosis Date  . Chest pain, unspecified   . Chronic airway obstruction, not elsewhere classified   . Chronic kidney disease    renal and ureteral stones  . Depressive disorder, not elsewhere classified   . Dermatophytosis of the body   . Dyspepsia and other specified  disorders of function of stomach   . Headache    frequently, daily   . History of kidney stones   . Obesity, unspecified   . Other abnormal blood chemistry   . Other and unspecified hyperlipidemia   . Other malaise and fatigue   . Overweight(278.02)   . Pain in limb   . Psychosexual dysfunction with inhibited sexual excitement   . Tobacco use disorder   . Unspecified disorder of skin and subcutaneous tissue   . Unspecified essential hypertension   . Wheezing     Past Surgical History:  Procedure Laterality Date  . ANAL FISSURE REPAIR  1985  . CYSTOSCOPY WITH STENT PLACEMENT Right 04/24/2016   Procedure: CYSTOSCOPY WITH STENT PLACEMENT;  Surgeon: Franchot Gallo, MD;  Location: [facility];  Service: Urology;  Laterality: Right;  . CYSTOSCOPY/RETROGRADE/URETEROSCOPY/STONE EXTRACTION WITH BASKET Right 04/24/2016   Procedure: CYSTOSCOPY/RETROGRADE/URETEROSCOPY/STONE EXTRACTION WITH BASKETAND STENT PLACEMENT;  Surgeon: Franchot Gallo, MD;  Location: [facility];  Service: Urology;  Laterality: Right;  . HOLMIUM LASER APPLICATION Right 3/54/6568   Procedure: HOLMIUM LASER APPLICATION;  Surgeon: Franchot Gallo, MD;  Location: [facility];  Service: Urology;  Laterality: Right;  . right renal stone removed  1983  Dr Serita Butcher    Allergies  Allergen Reactions  . Zoloft [Sertraline Hcl] Other (See Comments)    Lost points in time while taking the medication so weaned himself off of it  . Codeine Itching and Swelling    Outpatient Encounter Medications as of 09/30/2018  Medication Sig  . amLODipine (NORVASC) 10 MG tablet Take 1 tablet by mouth once daily for blood pressure  . aspirin 81 MG tablet Take 81 mg by mouth daily.   Marland Kitchen atorvastatin (LIPITOR) 80 MG tablet TAKE 1 TABLET BY MOUTH ONCE DAILY AT  6PM  FOR  CHOLESTEROL  . multivitamin (ONE-A-DAY MEN'S) TABS Take 1 tablet by mouth daily.   . Omega-3 Fatty Acids (FISH OIL) 1200 MG CAPS Take 1,200 mg by mouth daily.   . tamsulosin (FLOMAX)  0.4 MG CAPS capsule Take 1 capsule by mouth once daily   No facility-administered encounter medications on file as of 09/30/2018.     Review of Systems:  Review of Systems  Constitutional: Negative for chills, fever and malaise/fatigue.  HENT: Negative for congestion and hearing loss.   Eyes: Negative for blurred vision.  Respiratory: Positive for cough and shortness of breath. Negative for wheezing.   Cardiovascular: Negative for chest pain, palpitations and leg swelling.  Gastrointestinal: Negative for abdominal pain, blood in stool, constipation, diarrhea and melena.  Genitourinary: Negative for dysuria, frequency and urgency.       Difficulty urinating if he does not use flomax  Musculoskeletal: Positive for neck pain.       C7 disc herniation in past, never repaired  Skin: Negative for itching and rash.  Neurological: Positive for tingling and sensory change. Negative for dizziness and loss of consciousness.  Endo/Heme/Allergies: Does not bruise/bleed easily.  Psychiatric/Behavioral: Negative for depression and memory loss. The patient is not nervous/anxious and does not have insomnia.     Health Maintenance  Topic Date Due  . Hepatitis C Screening  02/26/2019 (Originally Feb 20, 1961)  . HIV Screening  02/26/2019 (Originally 09/18/1975)  . INFLUENZA VACCINE  11/13/2018  . TETANUS/TDAP  04/15/2019  . COLONOSCOPY  11/12/2020    Physical Exam: Vitals:   09/30/18 0801  BP: 120/70  Pulse: 88  Temp: 97.7 F (36.5 C)  TempSrc: Oral  SpO2: 96%  Weight: 213 lb (96.6 kg)  Height: 5\' 11"  (1.803 m)   Body mass index is 29.71 kg/m. Physical Exam Vitals signs reviewed.  Constitutional:      Appearance: Normal appearance. He is normal weight.  HENT:     Head: Normocephalic and atraumatic.  Cardiovascular:     Rate and Rhythm: Normal rate and regular rhythm.     Pulses: Normal pulses.     Heart sounds: Normal heart sounds.  Pulmonary:     Effort: Pulmonary effort is normal.      Breath sounds: Wheezing present. No rhonchi or rales.     Comments: Right-sided mild wheezes Abdominal:     General: Bowel sounds are normal.  Musculoskeletal: Normal range of motion.        General: No swelling or tenderness.     Right lower leg: No edema.     Left lower leg: No edema.  Skin:    General: Skin is warm and dry.     Findings: Bruising present.     Comments: Left arm  Neurological:     General: No focal deficit present.     Mental Status: He is alert and oriented to person, place, and time. Mental status is  at baseline.  Psychiatric:        Mood and Affect: Mood normal.        Behavior: Behavior normal.        Thought Content: Thought content normal.        Judgment: Judgment normal.     Labs reviewed: Basic Metabolic Panel: Recent Labs    02/25/18 0834  NA 142  K 4.1  CL 107  CO2 28  GLUCOSE 88  BUN 19  CREATININE 1.00  CALCIUM 10.0   Liver Function Tests: No results for input(s): AST, ALT, ALKPHOS, BILITOT, PROT, ALBUMIN in the last 8760 hours. No results for input(s): LIPASE, AMYLASE in the last 8760 hours. No results for input(s): AMMONIA in the last 8760 hours. CBC: Recent Labs    02/25/18 0834  WBC 7.3  NEUTROABS 4,190  HGB 16.4  HCT 46.9  MCV 89.7  PLT 210   Lipid Panel: Recent Labs    02/25/18 0834  CHOL 151  HDL 48  LDLCALC 74  TRIG 197*  CHOLHDL 3.1   Lab Results  Component Value Date   HGBA1C 5.1 06/09/2017    Procedures since last visit: No results found.  Assessment/Plan 1. Chronic obstructive pulmonary disease, unspecified COPD type ([place]) -recommended of course smoking cessation--he is cutting gradually down, but not ready to quit - albuterol (VENTOLIN HFA) 108 (90 Base) MCG/ACT inhaler; Inhale 2 puffs into the lungs every 6 (six) hours as needed for wheezing or shortness of breath.  Dispense: 1 Inhaler; Refill: 5  2. Tobacco use disorder -counseled today about cessation  3. History of nephrolithiasis -passed  two stones recently  4. Pure hypercholesterolemia - cont lipitor therapy, f/u labs today - Lipid panel  5. Essential hypertension, benign -bp at goal, cont amlodipine therapy - CBC with Differential/Platelet - COMPLETE METABOLIC PANEL WITH GFR  6. Benign prostatic hyperplasia with urinary hesitancy -cont flomax which is helpful  7. Hyperglycemia - cont to work on healthy diet - Hemoglobin A1c  Labs/tests ordered:   Lab Orders     CBC with Differential/Platelet     COMPLETE METABOLIC PANEL WITH GFR     Hemoglobin A1c     Lipid panel  Next appt:  4 mos med mgt  Khaleed Holan L. Kawehi Hostetter, D.O. [facility] [address], [place] [address] Cell Phone (Mon-Fri 8am-5pm):  [phone] On Call:  [phone] & follow prompts after 5pm & weekends Office Phone:  [phone] Office Fax:  [phone]

## 2018-10-01 LAB — LIPID PANEL
Cholesterol: 127 mg/dL (ref <200)
HDL: 46 mg/dL (ref >=40)
LDL Cholesterol (Calc): 66 mg/dL (calc)
Non-HDL Cholesterol (Calc): 81 mg/dL (calc) (ref <130)
Total CHOL/HDL Ratio: 2.8 (calc) (ref <5.0)
Triglycerides: 73 mg/dL (ref <150)

## 2018-10-01 LAB — HEMOGLOBIN A1C
Hgb A1c MFr Bld: 5.1 % of total Hgb (ref <5.7)
Mean Plasma Glucose: 100 (calc)
eAG (mmol/L): 5.5 (calc)

## 2018-10-01 LAB — CBC WITH DIFFERENTIAL/PLATELET
Absolute Monocytes: 582 cells/uL (ref 200–950)
Basophils Absolute: 42 cells/uL (ref 0–200)
Basophils Relative: 0.7 %
Eosinophils Absolute: 198 cells/uL (ref 15–500)
Eosinophils Relative: 3.3 %
HCT: 47.7 % (ref 38.5–50.0)
Hemoglobin: 16.7 g/dL (ref 13.2–17.1)
Lymphs Abs: 2262 cells/uL (ref 850–3900)
MCH: 32.1 pg (ref 27.0–33.0)
MCHC: 35 g/dL (ref 32.0–36.0)
MCV: 91.7 fL (ref 80.0–100.0)
MPV: 9.9 fL (ref 7.5–12.5)
Monocytes Relative: 9.7 %
Neutro Abs: 2916 cells/uL (ref 1500–7800)
Neutrophils Relative %: 48.6 %
Platelets: 181 10*3/uL (ref 140–400)
RBC: 5.2 10*6/uL (ref 4.20–5.80)
RDW: 13.5 % (ref 11.0–15.0)
Total Lymphocyte: 37.7 %
WBC: 6 10*3/uL (ref 3.8–10.8)

## 2018-10-01 LAB — COMPLETE METABOLIC PANEL WITH GFR
AG Ratio: 2.4 (calc) (ref 1.0–2.5)
ALT: 19 U/L (ref 9–46)
AST: 17 U/L (ref 10–35)
Albumin: 4.1 g/dL (ref 3.6–5.1)
Alkaline phosphatase (APISO): 67 U/L (ref 35–144)
BUN: 13 mg/dL (ref 7–25)
CO2: 26 mmol/L (ref 20–32)
Calcium: 9.7 mg/dL (ref 8.6–10.3)
Chloride: 107 mmol/L (ref 98–110)
Creat: 0.95 mg/dL (ref 0.70–1.33)
GFR, Est African American: 102 mL/min/{1.73_m2} (ref >=60)
GFR, Est Non African American: 88 mL/min/{1.73_m2} (ref >=60)
Globulin: 1.7 g/dL (calc) — ABNORMAL LOW (ref 1.9–3.7)
Glucose, Bld: 86 mg/dL (ref 65–99)
Potassium: 3.8 mmol/L (ref 3.5–5.3)
Sodium: 140 mmol/L (ref 135–146)
Total Bilirubin: 0.5 mg/dL (ref 0.2–1.2)
Total Protein: 5.8 g/dL — ABNORMAL LOW (ref 6.1–8.1)

## 2018-11-01 ENCOUNTER — Other Ambulatory Visit: Payer: Self-pay | Admitting: Internal Medicine

## 2019-02-03 ENCOUNTER — Ambulatory Visit: Payer: 59 | Admitting: Internal Medicine

## 2019-02-07 ENCOUNTER — Other Ambulatory Visit: Payer: Self-pay

## 2019-02-07 ENCOUNTER — Encounter: Payer: Self-pay | Admitting: Internal Medicine

## 2019-02-07 ENCOUNTER — Ambulatory Visit (INDEPENDENT_AMBULATORY_CARE_PROVIDER_SITE_OTHER): Payer: 59 | Admitting: Internal Medicine

## 2019-02-07 VITALS — BP 138/78 | HR 82 | Temp 98.0°F | Ht 71.0 in | Wt 204.0 lb

## 2019-02-07 DIAGNOSIS — F17209 Nicotine dependence, unspecified, with unspecified nicotine-induced disorders: Secondary | ICD-10-CM

## 2019-02-07 DIAGNOSIS — Z87442 Personal history of urinary calculi: Secondary | ICD-10-CM

## 2019-02-07 DIAGNOSIS — Z23 Encounter for immunization: Secondary | ICD-10-CM | POA: Diagnosis not present

## 2019-02-07 DIAGNOSIS — Z716 Tobacco abuse counseling: Secondary | ICD-10-CM

## 2019-02-07 DIAGNOSIS — Z114 Encounter for screening for human immunodeficiency virus [HIV]: Secondary | ICD-10-CM

## 2019-02-07 DIAGNOSIS — R739 Hyperglycemia, unspecified: Secondary | ICD-10-CM

## 2019-02-07 DIAGNOSIS — E7800 Pure hypercholesterolemia, unspecified: Secondary | ICD-10-CM

## 2019-02-07 DIAGNOSIS — Z1159 Encounter for screening for other viral diseases: Secondary | ICD-10-CM

## 2019-02-07 DIAGNOSIS — J449 Chronic obstructive pulmonary disease, unspecified: Secondary | ICD-10-CM

## 2019-02-07 MED ORDER — ROSUVASTATIN CALCIUM 10 MG PO TABS: 10.0000 mg | ORAL_TABLET | Freq: Every day | ORAL | 3 refills | Status: DC

## 2019-02-07 NOTE — Patient Instructions (Signed)
Please come fasting next time.  You may have black coffee and water only.  Try to keep cutting down on cigarettes!

## 2019-02-07 NOTE — Progress Notes (Signed)
Location:  [facility] clinic  Provider: Dr. Hollace Kinnier  Code Status:  Goals of Care:  Advanced Directives 06/11/2017  Does Patient Have a Medical Advance Directive? No  Would patient like information on creating a medical advance directive? No - Patient declined     Chief Complaint  Patient presents with  . Medical Management of Chronic Issues    73-month followup for medication management.  . Immunizations    Flu shot received today    HPI: Patient is a 58 y.o. male seen today for medical management of chronic diseases.    He is still smoking. Will smoke cigarettes, about 8-9 daily. His cough is the same, it has not progressed. He has only used albuterol inhaler twice since it was prescribed. He will cough up thin clear secretions in the AM.   Blood pressure meds taken daily. He restricts from salt in his diet. Denies blurred vision, chest pain, or dizziness.   Drinks 2-3 cups of coffee daily.   He works 12 hours daily and has increased stress from his job. Claims covid has increased their workload.   Complains of muscle pain and joint pain. He is worried it might be caused by lipitor. Asking to be put back on crestor.   Still taking flomax. No new incidents of kidney stones.   Requesting flu vaccine today.   Had coffee with cream before appointment.    Past Medical History:  Diagnosis Date  . Chest pain, unspecified   . Chronic airway obstruction, not elsewhere classified   . Chronic kidney disease    renal and ureteral stones  . Depressive disorder, not elsewhere classified   . Dermatophytosis of the body   . Dyspepsia and other specified disorders of function of stomach   . Headache    frequently, daily   . History of kidney stones   . Obesity, unspecified   . Other abnormal blood chemistry   . Other and unspecified hyperlipidemia   . Other malaise and fatigue   . Overweight(278.02)   . Pain in limb   . Psychosexual dysfunction with inhibited sexual  excitement   . Tobacco use disorder   . Unspecified disorder of skin and subcutaneous tissue   . Unspecified essential hypertension   . Wheezing     Past Surgical History:  Procedure Laterality Date  . ANAL FISSURE REPAIR  1985  . CYSTOSCOPY WITH STENT PLACEMENT Right 04/24/2016   Procedure: CYSTOSCOPY WITH STENT PLACEMENT;  Surgeon: Franchot Gallo, MD;  Location: [facility];  Service: Urology;  Laterality: Right;  . CYSTOSCOPY/RETROGRADE/URETEROSCOPY/STONE EXTRACTION WITH BASKET Right 04/24/2016   Procedure: CYSTOSCOPY/RETROGRADE/URETEROSCOPY/STONE EXTRACTION WITH BASKETAND STENT PLACEMENT;  Surgeon: Franchot Gallo, MD;  Location: [facility];  Service: Urology;  Laterality: Right;  . HOLMIUM LASER APPLICATION Right [id number]   Procedure: HOLMIUM LASER APPLICATION;  Surgeon: Franchot Gallo, MD;  Location: [facility];  Service: Urology;  Laterality: Right;  . right renal stone removed  1983   Dr Serita Butcher    Allergies  Allergen Reactions  . Zoloft [Sertraline Hcl] Other (See Comments)    Lost points in time while taking the medication so weaned himself off of it  . Codeine Itching and Swelling    Outpatient Encounter Medications as of 02/07/2019  Medication Sig  . albuterol (VENTOLIN HFA) 108 (90 Base) MCG/ACT inhaler Inhale 2 puffs into the lungs every 6 (six) hours as needed for wheezing or shortness of breath.  Marland Kitchen amLODipine (NORVASC) 10 MG tablet Take 1 tablet (10 mg  total) by mouth daily.  Marland Kitchen aspirin 81 MG tablet Take 81 mg by mouth daily.   Marland Kitchen atorvastatin (LIPITOR) 80 MG tablet TAKE 1 TABLET BY MOUTH ONCE DAILY AT  6PM  FOR  CHOLESTEROL  . multivitamin (ONE-A-DAY MEN'S) TABS Take 1 tablet by mouth daily.   . Omega-3 Fatty Acids (FISH OIL) 1200 MG CAPS Take 1,200 mg by mouth daily.   . tamsulosin (FLOMAX) 0.4 MG CAPS capsule Take 1 capsule by mouth once daily   No facility-administered encounter medications on file as of 02/07/2019.     Review of Systems:  Review of Systems   Constitutional: Negative for activity change, appetite change and fatigue.  HENT: Negative for dental problem, hearing loss and trouble swallowing.   Eyes: Negative for photophobia and visual disturbance.  Respiratory: Positive for cough and wheezing. Negative for shortness of breath.   Cardiovascular: Negative for chest pain and leg swelling.  Gastrointestinal: Negative for abdominal pain, constipation and diarrhea.  Endocrine: Negative for polydipsia, polyphagia and polyuria.  Genitourinary: Positive for frequency. Negative for dysuria and hematuria.  Musculoskeletal: Positive for arthralgias and myalgias.  Skin: Negative.   Neurological: Negative for dizziness, weakness and headaches.  Psychiatric/Behavioral: Negative for dysphoric mood and sleep disturbance. The patient is not nervous/anxious.   All other systems reviewed and are negative.   Health Maintenance  Topic Date Due  . INFLUENZA VACCINE  11/13/2018  . Hepatitis C Screening  02/26/2019 (Originally [birth year])  . HIV Screening  02/26/2019 (Originally 09/18/1975)  . TETANUS/TDAP  04/15/2019  . COLONOSCOPY  11/12/2020    Physical Exam: Vitals:   02/07/19 1045  BP: 138/78  Pulse: 82  Temp: 98 F (36.7 C)  TempSrc: Oral  SpO2: 97%  Weight: 204 lb (92.5 kg)  Height: 5\' 11"  (1.803 m)   Body mass index is 28.45 kg/m. Physical Exam Vitals signs reviewed.  Constitutional:      Appearance: Normal appearance. He is normal weight.  Cardiovascular:     Rate and Rhythm: Normal rate and regular rhythm.     Pulses: Normal pulses.     Heart sounds: Normal heart sounds.  Neurological:     Mental Status: He is alert.     Labs reviewed: Basic Metabolic Panel: Recent Labs    02/25/18 0834 09/30/18 0853  NA 142 140  K 4.1 3.8  CL 107 107  CO2 28 26  GLUCOSE 88 86  BUN 19 13  CREATININE 1.00 0.95  CALCIUM 10.0 9.7   Liver Function Tests: Recent Labs    09/30/18 0853  AST 17  ALT 19  BILITOT 0.5  PROT 5.8*    No results for input(s): LIPASE, AMYLASE in the last 8760 hours. No results for input(s): AMMONIA in the last 8760 hours. CBC: Recent Labs    02/25/18 0834 09/30/18 0853  WBC 7.3 6.0  NEUTROABS 4,190 2,916  HGB 16.4 16.7  HCT 46.9 47.7  MCV 89.7 91.7  PLT 210 181   Lipid Panel: Recent Labs    02/25/18 0834 09/30/18 0853  CHOL 151 127  HDL 48 46  LDLCALC 74 66  TRIG 197* 73  CHOLHDL 3.1 2.8   Lab Results  Component Value Date   HGBA1C 5.1 09/30/2018    Procedures since last visit: No results found.  Assessment/Plan 1. Need for influenza vaccination - Flu Vaccine QUAD 6+ mos PF IM (Fluarix Quad PF)  2. Tobacco use disorder, continutous - he continues to smoke - he has not changed his smoking  habits - he does not have any interest in smoking cessation today  3. Tobacco abuse counseling - he has been reminded about the risks of smoking cigarettes - he has been given information about smoking cessation  4. Pure hypercholesterolemia - he denies eating foods high in fat or fried foods - he claims his lipitor is giving him muscle aches - discontinue lipitor - Start crestor 10 mg daily - recheck lipid panel- future  5. History of nephrolithiasis - stable at this time - followed by urology - continue current flomax dosage  6. Chronic obstructive pulmonary disease, unspecified COPD type ([place]) - stable, no progression - wheezing present during examination - has only used albuterol twice since prescribed - complete blood panel with differential/platelets- today  7. Hyperglycemia - stable, no progression - continue to limit carbs and sugars from diet - hemoglobin A1C- today - complete metabolic panel with GFR- today  8. Need for screening for HIV - HIV screening test - today  9. Encounter for hepatitis C screening test for low risk patient - hepatitis C screening test- today    Labs/tests ordered: complete blood panel with differential/platelets,  complete metabolic panel with GFR, hemoglobin A1C, hepatitis C antibody, HIV antibody- today Lipid panel- future Next appt:  6 month follow up

## 2019-02-08 LAB — COMPLETE METABOLIC PANEL WITH GFR
AG Ratio: 2.4 (calc) (ref 1.0–2.5)
ALT: 17 U/L (ref 9–46)
AST: 15 U/L (ref 10–35)
Albumin: 4.3 g/dL (ref 3.6–5.1)
Alkaline phosphatase (APISO): 68 U/L (ref 35–144)
BUN: 24 mg/dL (ref 7–25)
CO2: 26 mmol/L (ref 20–32)
Calcium: 9.9 mg/dL (ref 8.6–10.3)
Chloride: 107 mmol/L (ref 98–110)
Creat: 0.94 mg/dL (ref 0.70–1.33)
GFR, Est African American: 103 mL/min/{1.73_m2} (ref >=60)
GFR, Est Non African American: 89 mL/min/{1.73_m2} (ref >=60)
Globulin: 1.8 g/dL (calc) — ABNORMAL LOW (ref 1.9–3.7)
Glucose, Bld: 102 mg/dL (ref 65–139)
Potassium: 4.3 mmol/L (ref 3.5–5.3)
Sodium: 142 mmol/L (ref 135–146)
Total Bilirubin: 0.3 mg/dL (ref 0.2–1.2)
Total Protein: 6.1 g/dL (ref 6.1–8.1)

## 2019-02-08 LAB — HEMOGLOBIN A1C
Hgb A1c MFr Bld: 5.1 % of total Hgb (ref <5.7)
Mean Plasma Glucose: 100 (calc)
eAG (mmol/L): 5.5 (calc)

## 2019-02-08 LAB — CBC WITH DIFFERENTIAL/PLATELET
Absolute Monocytes: 570 cells/uL (ref 200–950)
Basophils Absolute: 30 cells/uL (ref 0–200)
Basophils Relative: 0.5 %
Eosinophils Absolute: 210 cells/uL (ref 15–500)
Eosinophils Relative: 3.5 %
HCT: 49.2 % (ref 38.5–50.0)
Hemoglobin: 16.6 g/dL (ref 13.2–17.1)
Lymphs Abs: 1818 cells/uL (ref 850–3900)
MCH: 30.7 pg (ref 27.0–33.0)
MCHC: 33.7 g/dL (ref 32.0–36.0)
MCV: 91.1 fL (ref 80.0–100.0)
MPV: 9.6 fL (ref 7.5–12.5)
Monocytes Relative: 9.5 %
Neutro Abs: 3372 cells/uL (ref 1500–7800)
Neutrophils Relative %: 56.2 %
Platelets: 188 10*3/uL (ref 140–400)
RBC: 5.4 10*6/uL (ref 4.20–5.80)
RDW: 13.4 % (ref 11.0–15.0)
Total Lymphocyte: 30.3 %
WBC: 6 10*3/uL (ref 3.8–10.8)

## 2019-02-08 LAB — HEPATITIS C ANTIBODY
Hepatitis C Ab: NONREACTIVE
SIGNAL TO CUT-OFF: 0 (ref <1.00)

## 2019-02-08 LAB — HIV ANTIBODY (ROUTINE TESTING W REFLEX): HIV 1&2 Ab, 4th Generation: NONREACTIVE

## 2019-04-03 ENCOUNTER — Other Ambulatory Visit: Payer: Self-pay | Admitting: Internal Medicine

## 2019-05-01 ENCOUNTER — Other Ambulatory Visit: Payer: Self-pay | Admitting: Internal Medicine

## 2019-07-06 ENCOUNTER — Other Ambulatory Visit: Payer: Self-pay | Admitting: Internal Medicine

## 2019-08-03 ENCOUNTER — Other Ambulatory Visit: Payer: Self-pay | Admitting: Internal Medicine

## 2019-08-11 ENCOUNTER — Encounter: Payer: Self-pay | Admitting: Internal Medicine

## 2019-08-11 ENCOUNTER — Other Ambulatory Visit: Payer: Self-pay

## 2019-08-11 ENCOUNTER — Ambulatory Visit (INDEPENDENT_AMBULATORY_CARE_PROVIDER_SITE_OTHER): Payer: 59 | Admitting: Internal Medicine

## 2019-08-11 VITALS — BP 140/85 | HR 79 | Temp 97.5°F | Ht 71.0 in | Wt 216.6 lb

## 2019-08-11 DIAGNOSIS — J449 Chronic obstructive pulmonary disease, unspecified: Secondary | ICD-10-CM

## 2019-08-11 DIAGNOSIS — I10 Essential (primary) hypertension: Secondary | ICD-10-CM

## 2019-08-11 DIAGNOSIS — R3911 Hesitancy of micturition: Secondary | ICD-10-CM | POA: Diagnosis not present

## 2019-08-11 DIAGNOSIS — Z87442 Personal history of urinary calculi: Secondary | ICD-10-CM

## 2019-08-11 DIAGNOSIS — Z7189 Other specified counseling: Secondary | ICD-10-CM | POA: Diagnosis not present

## 2019-08-11 DIAGNOSIS — N401 Enlarged prostate with lower urinary tract symptoms: Secondary | ICD-10-CM | POA: Diagnosis not present

## 2019-08-11 DIAGNOSIS — R739 Hyperglycemia, unspecified: Secondary | ICD-10-CM

## 2019-08-11 DIAGNOSIS — F17209 Nicotine dependence, unspecified, with unspecified nicotine-induced disorders: Secondary | ICD-10-CM

## 2019-08-11 DIAGNOSIS — E7800 Pure hypercholesterolemia, unspecified: Secondary | ICD-10-CM

## 2019-08-11 NOTE — Patient Instructions (Signed)
Reminder to get your living will and health care power of attorney done.

## 2019-08-11 NOTE — Progress Notes (Signed)
Location:  [facility] clinic Provider:  Jaymin Waln L. Mariea Clonts, D.O., C.M.D.  Goals of Care: Knows if something goes wrong, to call his oldest daughter oldest daughter--full code at this time; if no quality of life, does not want his life prolonged  Advanced Directives 08/11/2019  Does Patient Have a Medical Advance Directive? No  Would patient like information on creating a medical advance directive? No - Patient declined   Chief Complaint  Patient presents with  . Medical Management of Chronic Issues    6 month follow up  with labs     HPI: Patient is a 59 y.o. male seen today for medical management of chronic diseases.    covid vaccine kicked him in the butt.  He had it on Tuesday and he's been achy and miserable.  His fingers and toes are achy.    Flomax worked great for years, but it's getting much worse trying to urinate to where he has to sit down and wait it out.  Only a little will come out and then drip, drip, drip.    Has not had coffee this am.  Did have two cigarettes.  BP is up.   He's fasting and ready for labs.  Still smoking 1/2ppd.  He has gained 12 lbs.  Runs have changed.  He's stopped at times at night and sitting in front of TV eating.   He has quit this now b/c cardiologist also noted.    Past Medical History:  Diagnosis Date  . Chest pain, unspecified   . Chronic airway obstruction, not elsewhere classified   . Chronic kidney disease    renal and ureteral stones  . Depressive disorder, not elsewhere classified   . Dermatophytosis of the body   . Dyspepsia and other specified disorders of function of stomach   . Headache    frequently, daily   . History of kidney stones   . Obesity, unspecified   . Other abnormal blood chemistry   . Other and unspecified hyperlipidemia   . Other malaise and fatigue   . Overweight(278.02)   . Pain in limb   . Psychosexual dysfunction with inhibited sexual excitement   . Tobacco use disorder   . Unspecified disorder of skin and  subcutaneous tissue   . Unspecified essential hypertension   . Wheezing     Past Surgical History:  Procedure Laterality Date  . ANAL FISSURE REPAIR  1985  . CYSTOSCOPY WITH STENT PLACEMENT Right 04/24/2016   Procedure: CYSTOSCOPY WITH STENT PLACEMENT;  Surgeon: Franchot Gallo, MD;  Location: [facility];  Service: Urology;  Laterality: Right;  . CYSTOSCOPY/RETROGRADE/URETEROSCOPY/STONE EXTRACTION WITH BASKET Right 04/24/2016   Procedure: CYSTOSCOPY/RETROGRADE/URETEROSCOPY/STONE EXTRACTION WITH BASKETAND STENT PLACEMENT;  Surgeon: Franchot Gallo, MD;  Location: [facility];  Service: Urology;  Laterality: Right;  . HOLMIUM LASER APPLICATION Right [id number]   Procedure: HOLMIUM LASER APPLICATION;  Surgeon: Franchot Gallo, MD;  Location: [facility];  Service: Urology;  Laterality: Right;  . right renal stone removed  1983   Dr Serita Butcher    Allergies  Allergen Reactions  . Zoloft [Sertraline Hcl] Other (See Comments)    Lost points in time while taking the medication so weaned himself off of it  . Codeine Itching and Swelling    Outpatient Encounter Medications as of 08/11/2019  Medication Sig  . albuterol (VENTOLIN HFA) 108 (90 Base) MCG/ACT inhaler Inhale 2 puffs into the lungs every 6 (six) hours as needed for wheezing or shortness of breath.  Marland Kitchen amLODipine (NORVASC)  10 MG tablet Take 1 tablet by mouth once daily  . aspirin 81 MG tablet Take 81 mg by mouth daily.   . Metoprolol Succinate 25 MG CS24 Take 25 mg by mouth daily at 6 (six) AM.  . multivitamin (ONE-A-DAY MEN'S) TABS Take 1 tablet by mouth daily.   . Omega-3 Fatty Acids (FISH OIL) 1200 MG CAPS Take 1,200 mg by mouth daily.   . rosuvastatin (CRESTOR) 10 MG tablet Take 1 tablet (10 mg total) by mouth daily.  . tamsulosin (FLOMAX) 0.4 MG CAPS capsule Take 1 capsule by mouth once daily   No facility-administered encounter medications on file as of 08/11/2019.    Review of Systems:  Review of Systems  Constitutional: Negative for  chills, fever and malaise/fatigue.  HENT: Positive for hearing loss. Negative for congestion and sore throat.   Eyes: Negative for blurred vision.       Glasses  Respiratory: Positive for cough and sputum production. Negative for shortness of breath.   Cardiovascular: Negative for chest pain, palpitations and leg swelling.  Gastrointestinal: Negative for abdominal pain, blood in stool, constipation, diarrhea and melena.  Genitourinary: Negative for dysuria, frequency and urgency.       Difficulty starting stream, dribbling only, has to sit to urinate now  Musculoskeletal: Positive for joint pain. Negative for falls.  Skin: Negative for itching and rash.  Neurological: Negative for dizziness and loss of consciousness.  Endo/Heme/Allergies: Bruises/bleeds easily.  Psychiatric/Behavioral: Negative for depression and memory loss. The patient is not nervous/anxious and does not have insomnia.     Health Maintenance  Topic Date Due  . TETANUS/TDAP  04/15/2019  . INFLUENZA VACCINE  11/13/2019  . COLONOSCOPY  11/12/2020  . COVID-19 Vaccine  Completed  . Hepatitis C Screening  Completed  . HIV Screening  Completed    Physical Exam: Vitals:   08/11/19 0807  BP: (!) 140/100  Pulse: 79  Temp: (!) 97.5 F (36.4 C)  SpO2: 97%  Weight: 216 lb 9.6 oz (98.2 kg)  Height: 1' (0.305 m)   Body mass index is 1,057.54 kg/m. Physical Exam Vitals reviewed.  Constitutional:      Appearance: Normal appearance.  HENT:     Head: Normocephalic and atraumatic.  Cardiovascular:     Rate and Rhythm: Normal rate and regular rhythm.     Pulses: Normal pulses.     Heart sounds: Normal heart sounds.  Pulmonary:     Effort: Pulmonary effort is normal.     Breath sounds: Normal breath sounds. No wheezing, rhonchi or rales.  Abdominal:     General: Bowel sounds are normal.  Musculoskeletal:        General: Normal range of motion.     Right lower leg: No edema.     Left lower leg: No edema.   Neurological:     General: No focal deficit present.     Mental Status: He is alert and oriented to person, place, and time.  Psychiatric:        Mood and Affect: Mood normal.        Behavior: Behavior normal.     Labs reviewed: Basic Metabolic Panel: Recent Labs    09/30/18 0853 02/07/19 1201  NA 140 142  K 3.8 4.3  CL 107 107  CO2 26 26  GLUCOSE 86 102  BUN 13 24  CREATININE 0.95 0.94  CALCIUM 9.7 9.9   Liver Function Tests: Recent Labs    09/30/18 0853 02/07/19 1201  AST 17  15  ALT 19 17  BILITOT 0.5 0.3  PROT 5.8* 6.1   No results for input(s): LIPASE, AMYLASE in the last 8760 hours. No results for input(s): AMMONIA in the last 8760 hours. CBC: Recent Labs    09/30/18 0853 02/07/19 1201  WBC 6.0 6.0  NEUTROABS 2,916 3,372  HGB 16.7 16.6  HCT 47.7 49.2  MCV 91.7 91.1  PLT 181 188   Lipid Panel: Recent Labs    09/30/18 0853  CHOL 127  HDL 46  LDLCALC 66  TRIG 73  CHOLHDL 2.8   Lab Results  Component Value Date   HGBA1C 5.1 02/07/2019    Procedures since last visit: No results found.  Assessment/Plan 1. Benign prostatic hyperplasia with urinary hesitancy -worsening; pt has not been on finasteride so if PSA not up considerably will add it - recommended f/u with Dr. Diona Fanti, but he is still paying him off from his lithotripsy so prefers for me to manage this at this time - PSA, Total and Free  2. Tobacco use disorder, continuous -ongoing, he's not ready to quit--still smoking 1/2ppd  3. Pure hypercholesterolemia - cont crestor therapy, f/u lab - Lipid panel  4. History of nephrolithiasis - had lithotripsy  5. Chronic obstructive pulmonary disease, unspecified COPD type ([place]) -cont prn albuterol which has been adequate for him  6. Hyperglycemia -cont to work on losing weight he gained  Lab Results  Component Value Date   HGBA1C 5.1 02/07/2019   - Hemoglobin A1c  7. Essential hypertension, benign -bp elevated today after  two cigarettes and he's feeling really bad after his second covid vaccine and gained weight he's trying to lose again--will not change today--reassess next time - CBC with Differential/Platelet - COMPLETE METABOLIC PANEL WITH GFR  8.  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 Buffi Ewton, D.O. [facility] [address] [place], [place] [address] Cell Phone (Mon-Fri 8am-5pm):  [phone] On Call:  [phone] & follow prompts after 5pm & weekends Office Phone:  [phone] Office Fax:  [phone]

## 2019-08-12 LAB — COMPLETE METABOLIC PANEL WITH GFR
AG Ratio: 2.3 (calc) (ref 1.0–2.5)
ALT: 23 U/L (ref 9–46)
AST: 17 U/L (ref 10–35)
Albumin: 4.1 g/dL (ref 3.6–5.1)
Alkaline phosphatase (APISO): 69 U/L (ref 35–144)
BUN: 17 mg/dL (ref 7–25)
CO2: 27 mmol/L (ref 20–32)
Calcium: 10.1 mg/dL (ref 8.6–10.3)
Chloride: 106 mmol/L (ref 98–110)
Creat: 0.96 mg/dL (ref 0.70–1.33)
GFR, Est African American: 101 mL/min/{1.73_m2} (ref >=60)
GFR, Est Non African American: 87 mL/min/{1.73_m2} (ref >=60)
Globulin: 1.8 g/dL (calc) — ABNORMAL LOW (ref 1.9–3.7)
Glucose, Bld: 108 mg/dL — ABNORMAL HIGH (ref 65–99)
Potassium: 4.2 mmol/L (ref 3.5–5.3)
Sodium: 140 mmol/L (ref 135–146)
Total Bilirubin: 0.4 mg/dL (ref 0.2–1.2)
Total Protein: 5.9 g/dL — ABNORMAL LOW (ref 6.1–8.1)

## 2019-08-12 LAB — CBC WITH DIFFERENTIAL/PLATELET
Absolute Monocytes: 850 cells/uL (ref 200–950)
Basophils Absolute: 20 cells/uL (ref 0–200)
Basophils Relative: 0.4 %
Eosinophils Absolute: 180 cells/uL (ref 15–500)
Eosinophils Relative: 3.6 %
HCT: 49.1 % (ref 38.5–50.0)
Hemoglobin: 16.8 g/dL (ref 13.2–17.1)
Lymphs Abs: 1505 cells/uL (ref 850–3900)
MCH: 31.2 pg (ref 27.0–33.0)
MCHC: 34.2 g/dL (ref 32.0–36.0)
MCV: 91.1 fL (ref 80.0–100.0)
MPV: 9.5 fL (ref 7.5–12.5)
Monocytes Relative: 17 %
Neutro Abs: 2445 cells/uL (ref 1500–7800)
Neutrophils Relative %: 48.9 %
Platelets: 168 10*3/uL (ref 140–400)
RBC: 5.39 10*6/uL (ref 4.20–5.80)
RDW: 13.2 % (ref 11.0–15.0)
Total Lymphocyte: 30.1 %
WBC: 5 10*3/uL (ref 3.8–10.8)

## 2019-08-12 LAB — LIPID PANEL
Cholesterol: 171 mg/dL (ref <200)
HDL: 57 mg/dL (ref >=40)
LDL Cholesterol (Calc): 95 mg/dL (calc)
Non-HDL Cholesterol (Calc): 114 mg/dL (calc) (ref <130)
Total CHOL/HDL Ratio: 3 (calc) (ref <5.0)
Triglycerides: 97 mg/dL (ref <150)

## 2019-08-12 LAB — HEMOGLOBIN A1C
Hgb A1c MFr Bld: 5.1 % of total Hgb (ref <5.7)
Mean Plasma Glucose: 100 (calc)
eAG (mmol/L): 5.5 (calc)

## 2019-08-12 LAB — PSA, TOTAL AND FREE
PSA, % Free: 15 % (calc) — ABNORMAL LOW (ref >25)
PSA, Free: 0.2 ng/mL
PSA, Total: 1.3 ng/mL (ref <=4.0)

## 2019-12-08 ENCOUNTER — Ambulatory Visit: Payer: 59 | Admitting: Internal Medicine

## 2019-12-12 ENCOUNTER — Encounter: Payer: Self-pay | Admitting: Internal Medicine

## 2019-12-12 ENCOUNTER — Ambulatory Visit (INDEPENDENT_AMBULATORY_CARE_PROVIDER_SITE_OTHER): Payer: 59 | Admitting: Internal Medicine

## 2019-12-12 ENCOUNTER — Other Ambulatory Visit: Payer: Self-pay

## 2019-12-12 VITALS — BP 128/78 | HR 78 | Temp 97.0°F | Ht 71.0 in | Wt 215.0 lb

## 2019-12-12 DIAGNOSIS — K5901 Slow transit constipation: Secondary | ICD-10-CM

## 2019-12-12 DIAGNOSIS — E7800 Pure hypercholesterolemia, unspecified: Secondary | ICD-10-CM

## 2019-12-12 DIAGNOSIS — J449 Chronic obstructive pulmonary disease, unspecified: Secondary | ICD-10-CM | POA: Diagnosis not present

## 2019-12-12 DIAGNOSIS — N401 Enlarged prostate with lower urinary tract symptoms: Secondary | ICD-10-CM

## 2019-12-12 DIAGNOSIS — F17209 Nicotine dependence, unspecified, with unspecified nicotine-induced disorders: Secondary | ICD-10-CM

## 2019-12-12 DIAGNOSIS — R3911 Hesitancy of micturition: Secondary | ICD-10-CM

## 2019-12-12 DIAGNOSIS — R739 Hyperglycemia, unspecified: Secondary | ICD-10-CM

## 2019-12-12 DIAGNOSIS — I10 Essential (primary) hypertension: Secondary | ICD-10-CM | POA: Diagnosis not present

## 2019-12-12 NOTE — Progress Notes (Signed)
Location:  [facility] clinic Provider:  Thyra Yinger L. Mariea Clonts, D.O., C.M.D.   Goals of Care:  Advanced Directives 12/12/2019  Does Patient Have a Medical Advance Directive? No  Would patient like information on creating a medical advance directive? No - Patient declined  still has not done ACP--he's trying to get Maudie Mercury to sit down so they can do this  Chief Complaint  Patient presents with  . Medical Management of Chronic Issues    4 month follow up   . Health Maintenance    influenza , tetanus    HPI: Patient is a 59 y.o. male seen today for medical management of chronic diseases.    He's sore from putting shingles around the house.  He did a lot of walking after not for a while.  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    Otherwise has nothing to c/o.    Influenza--should get high dose.    Tetanus--get a pharmacy.    Still has trouble peeing--it's slow.  If he forgets the flomax, it's bad.  He has quit drinking iced tea and no kidney stones since.  He started with the stones after a ton of iced tea intake.   After his coffee, he does go a lot.    No wheezing, sob as long as he doesn't try running a mile.  He does not smoke a lot, but still smoking.  He can tell when he smokes one.  Is trying to avoid unless he gets fidgety.  One carton over 2 weeks b/w him and his wife.    Past Medical History:  Diagnosis Date  . Chest pain, unspecified   . Chronic airway obstruction, not elsewhere classified   . Chronic kidney disease    renal and ureteral stones  . Depressive disorder, not elsewhere classified   . Dermatophytosis of the body   . Dyspepsia and other specified disorders of function of stomach   . Headache    frequently, daily   . History of kidney stones   . Obesity, unspecified   . Other abnormal blood chemistry   . Other and unspecified hyperlipidemia   . Other malaise and fatigue   . Overweight(278.02)   . Pain in limb   . Psychosexual dysfunction with inhibited  sexual excitement   . Tobacco use disorder   . Unspecified disorder of skin and subcutaneous tissue   . Unspecified essential hypertension   . Wheezing     Past Surgical History:  Procedure Laterality Date  . ANAL FISSURE REPAIR  1985  . CYSTOSCOPY WITH STENT PLACEMENT Right 04/24/2016   Procedure: CYSTOSCOPY WITH STENT PLACEMENT;  Surgeon: Franchot Gallo, MD;  Location: [facility];  Service: Urology;  Laterality: Right;  . CYSTOSCOPY/RETROGRADE/URETEROSCOPY/STONE EXTRACTION WITH BASKET Right 04/24/2016   Procedure: CYSTOSCOPY/RETROGRADE/URETEROSCOPY/STONE EXTRACTION WITH BASKETAND STENT PLACEMENT;  Surgeon: Franchot Gallo, MD;  Location: [facility];  Service: Urology;  Laterality: Right;  . HOLMIUM LASER APPLICATION Right 2/83/1517   Procedure: HOLMIUM LASER APPLICATION;  Surgeon: Franchot Gallo, MD;  Location: [facility];  Service: Urology;  Laterality: Right;  . right renal stone removed  1983   Dr Serita Butcher    Allergies  Allergen Reactions  . Zoloft [Sertraline Hcl] Other (See Comments)    Lost points in time while taking the medication so weaned himself off of it  . Codeine Itching and Swelling    Outpatient Encounter Medications as of 12/12/2019  Medication Sig  . albuterol (VENTOLIN HFA) 108 (90 Base) MCG/ACT inhaler  Inhale 2 puffs into the lungs every 6 (six) hours as needed for wheezing or shortness of breath.  Marland Kitchen amLODipine (NORVASC) 10 MG tablet Take 1 tablet by mouth once daily  . aspirin 81 MG tablet Take 81 mg by mouth daily.   . Metoprolol Succinate 25 MG CS24 Take 25 mg by mouth daily at 6 (six) AM.  . multivitamin (ONE-A-DAY MEN'S) TABS Take 1 tablet by mouth daily.   . Omega-3 Fatty Acids (FISH OIL) 1200 MG CAPS Take 1,200 mg by mouth daily.   . rosuvastatin (CRESTOR) 10 MG tablet Take 1 tablet (10 mg total) by mouth daily.  . tamsulosin (FLOMAX) 0.4 MG CAPS capsule Take 1 capsule by mouth once daily   No facility-administered encounter medications on file as of  12/12/2019.    Review of Systems:  Review of Systems  Constitutional: Negative for chills, fever and malaise/fatigue.  HENT: Negative for congestion and sore throat.   Eyes: Negative for blurred vision.  Respiratory: Positive for cough. Negative for shortness of breath.        Increased this am, with sick baby in family this past weekend  Cardiovascular: Negative for chest pain.  Gastrointestinal: Negative for abdominal pain, blood in stool, constipation, diarrhea and melena.  Genitourinary: Negative for dysuria.  Musculoskeletal: Positive for myalgias. Negative for falls.  Skin: Negative for itching and rash.  Neurological: Negative for dizziness and loss of consciousness.  Psychiatric/Behavioral: Negative for depression and memory loss. The patient is not nervous/anxious and does not have insomnia.        Tobacco abuse    Health Maintenance  Topic Date Due  . TETANUS/TDAP  04/15/2019  . INFLUENZA VACCINE  11/13/2019  . COLONOSCOPY  11/12/2020  . COVID-19 Vaccine  Completed  . Hepatitis C Screening  Completed  . HIV Screening  Completed    Physical Exam: Vitals:   12/12/19 0938  BP: 128/78  Pulse: 78  Temp: (!) 97 F (36.1 C)  TempSrc: Temporal  SpO2: 97%  Weight: 215 lb (97.5 kg)  Height: 5\' 11"  (1.803 m)   Body mass index is 29.99 kg/m. Physical Exam Vitals reviewed.  Constitutional:      General: He is not in acute distress.    Appearance: Normal appearance. He is not toxic-appearing.  HENT:     Head: Normocephalic and atraumatic.  Eyes:     Comments: glasses  Cardiovascular:     Rate and Rhythm: Normal rate and regular rhythm.     Pulses: Normal pulses.     Heart sounds: Normal heart sounds.  Pulmonary:     Effort: Pulmonary effort is normal.     Breath sounds: Rhonchi present.     Comments: Left base, but cleared with cough Abdominal:     General: Bowel sounds are normal.  Musculoskeletal:     Cervical back: Neck supple.  Neurological:      General: No focal deficit present.     Mental Status: He is alert and oriented to person, place, and time.     Motor: No weakness.     Gait: Gait normal.  Psychiatric:        Mood and Affect: Mood normal.        Behavior: Behavior normal.     Labs reviewed: Basic Metabolic Panel: Recent Labs    02/07/19 1201 08/11/19 0848  NA 142 140  K 4.3 4.2  CL 107 106  CO2 26 27  GLUCOSE 102 108*  BUN 24 17  CREATININE  0.94 0.96  CALCIUM 9.9 10.1   Liver Function Tests: Recent Labs    02/07/19 1201 08/11/19 0848  AST 15 17  ALT 17 23  BILITOT 0.3 0.4  PROT 6.1 5.9*   No results for input(s): LIPASE, AMYLASE in the last 8760 hours. No results for input(s): AMMONIA in the last 8760 hours. CBC: Recent Labs    02/07/19 1201 08/11/19 0848  WBC 6.0 5.0  NEUTROABS 3,372 2,445  HGB 16.6 16.8  HCT 49.2 49.1  MCV 91.1 91.1  PLT 188 168   Lipid Panel: Recent Labs    08/11/19 0848  CHOL 171  HDL 57  LDLCALC 95  TRIG 97  CHOLHDL 3.0   Lab Results  Component Value Date   HGBA1C 5.1 08/11/2019    Assessment/Plan 1. Essential hypertension, benign - bp at goal, cont same regimen and reducing cigarettes - CBC with Differential/Platelet  2. Chronic obstructive pulmonary disease, unspecified COPD type ([facility]) - cont albuterol prn - CBC with Differential/Platelet  3. Benign prostatic hyperplasia with urinary hesitancy - cont flomax and take faithfully  4. Slow transit constipation -encouraged hydration with water, avoiding excess caffeine, staying active and eating more vegetables  5. Hyperglycemia  Lab Results  Component Value Date   HGBA1C 5.1 08/11/2019  - Hemoglobin A1c - CBC with Differential/Platelet - Basic metabolic panel  6. Pure hypercholesterolemia - cont crestor therapy, f/u labs - Lipid panel - Basic metabolic panel  7. Tobacco use disorder, continuous -continue to cut down on cigarettes  Labs/tests ordered:   Lab Orders     Hemoglobin A1c      Lipid panel     CBC with Differential/Platelet     Basic metabolic panel   Next appt:  4 mos med mgt, come fasting for labs if needed  Kim Oki L. Mischelle Reeg, D.O. [facility] [address], [place] [address] Cell Phone (Mon-Fri 8am-5pm):  [phone] On Call:  [phone] & follow prompts after 5pm & weekends Office Phone:  [phone] Office Fax:  [phone]

## 2019-12-12 NOTE — Patient Instructions (Addendum)
Please go to [facility], [facility] or [facility] for a Tdap (tetanus booster).    Please sit down with your family to discuss your future health care decisions.   Constipation, Adult Constipation is when a person has fewer bowel movements in a week than normal, has difficulty having a bowel movement, or has stools that are dry, hard, or larger than normal. Constipation may be caused by an underlying condition. It may become worse with age if a person takes certain medicines and does not take in enough fluids. Follow these instructions at home: Eating and drinking   Eat foods that have a lot of fiber, such as fresh fruits and vegetables, whole grains, and beans.  Limit foods that are high in fat, low in fiber, or overly processed, such as french fries, hamburgers, cookies, candies, and soda.  Drink enough fluid to keep your urine clear or pale yellow. General instructions  Exercise regularly or as told by your health care provider.  Go to the restroom when you have the urge to go. Do not hold it in.  Take over-the-counter and prescription medicines only as told by your health care provider. These include any fiber supplements.  Practice pelvic floor retraining exercises, such as deep breathing while relaxing the lower abdomen and pelvic floor relaxation during bowel movements.  Watch your condition for any changes.  Keep all follow-up visits as told by your health care provider. This is important. Contact a health care provider if:  You have pain that gets worse.  You have a fever.  You do not have a bowel movement after 4 days.  You vomit.  You are not hungry.  You lose weight.  You are bleeding from the anus.  You have thin, pencil-like stools. Get help right away if:  You have a fever and your symptoms suddenly get worse.  You leak stool or have blood in your stool.  Your abdomen is bloated.  You have severe pain in your abdomen.  You feel dizzy or you faint. This  information is not intended to replace advice given to you by your health care provider. Make sure you discuss any questions you have with your health care provider. Document Revised: 03/13/2017 Document Reviewed: 09/19/2015 Elsevier Patient Education  2020 [employer].

## 2019-12-13 ENCOUNTER — Other Ambulatory Visit: Payer: Self-pay

## 2019-12-13 DIAGNOSIS — E7800 Pure hypercholesterolemia, unspecified: Secondary | ICD-10-CM

## 2019-12-13 LAB — CBC WITH DIFFERENTIAL/PLATELET
Absolute Monocytes: 580 cells/uL (ref 200–950)
Basophils Absolute: 43 cells/uL (ref 0–200)
Basophils Relative: 0.7 %
Eosinophils Absolute: 128 cells/uL (ref 15–500)
Eosinophils Relative: 2.1 %
HCT: 47.3 % (ref 38.5–50.0)
Hemoglobin: 16.3 g/dL (ref 13.2–17.1)
Lymphs Abs: 1909 cells/uL (ref 850–3900)
MCH: 31.4 pg (ref 27.0–33.0)
MCHC: 34.5 g/dL (ref 32.0–36.0)
MCV: 91.1 fL (ref 80.0–100.0)
MPV: 9.7 fL (ref 7.5–12.5)
Monocytes Relative: 9.5 %
Neutro Abs: 3440 cells/uL (ref 1500–7800)
Neutrophils Relative %: 56.4 %
Platelets: 177 10*3/uL (ref 140–400)
RBC: 5.19 10*6/uL (ref 4.20–5.80)
RDW: 13.2 % (ref 11.0–15.0)
Total Lymphocyte: 31.3 %
WBC: 6.1 10*3/uL (ref 3.8–10.8)

## 2019-12-13 LAB — BASIC METABOLIC PANEL
BUN: 17 mg/dL (ref 7–25)
CO2: 27 mmol/L (ref 20–32)
Calcium: 9.7 mg/dL (ref 8.6–10.3)
Chloride: 107 mmol/L (ref 98–110)
Creat: 0.9 mg/dL (ref 0.70–1.33)
Glucose, Bld: 91 mg/dL (ref 65–99)
Potassium: 4.2 mmol/L (ref 3.5–5.3)
Sodium: 141 mmol/L (ref 135–146)

## 2019-12-13 LAB — LIPID PANEL
Cholesterol: 162 mg/dL (ref <200)
HDL: 65 mg/dL (ref >=40)
LDL Cholesterol (Calc): 83 mg/dL (calc)
Non-HDL Cholesterol (Calc): 97 mg/dL (calc) (ref <130)
Total CHOL/HDL Ratio: 2.5 (calc) (ref <5.0)
Triglycerides: 48 mg/dL (ref <150)

## 2019-12-13 LAB — HEMOGLOBIN A1C
Hgb A1c MFr Bld: 5.1 % of total Hgb (ref <5.7)
Mean Plasma Glucose: 100 (calc)
eAG (mmol/L): 5.5 (calc)

## 2019-12-13 MED ORDER — ROSUVASTATIN CALCIUM 20 MG PO TABS
20.0000 mg | ORAL_TABLET | Freq: Every day | ORAL | 3 refills | Status: DC
Start: 2019-12-13 — End: 2020-02-01

## 2019-12-13 NOTE — Progress Notes (Signed)
Blood counts, electrolytes, kidneys and sugar average were all ok Due to his tobacco use and plaque build-up in arteries, I'd like his cholesterol to be a bit lower--goal LDL less than 70.  This means we need to increase his crestor to 20mg .  Please let him know and send in the new Rx.  He may take two of his current tabs to finish off his supply.

## 2020-01-03 ENCOUNTER — Other Ambulatory Visit: Payer: Self-pay | Admitting: Internal Medicine

## 2020-02-01 ENCOUNTER — Other Ambulatory Visit: Payer: Self-pay | Admitting: Internal Medicine

## 2020-02-01 ENCOUNTER — Other Ambulatory Visit: Payer: Self-pay | Admitting: *Deleted

## 2020-02-01 DIAGNOSIS — E7800 Pure hypercholesterolemia, unspecified: Secondary | ICD-10-CM

## 2020-02-01 MED ORDER — ROSUVASTATIN CALCIUM 20 MG PO TABS: 20.0000 mg | ORAL_TABLET | Freq: Every day | ORAL | 1 refills | Status: DC

## 2020-02-01 NOTE — Telephone Encounter (Signed)
Wife requested refill. Only received #30 with last Rx from pharmacy.

## 2020-03-30 ENCOUNTER — Other Ambulatory Visit: Payer: Self-pay | Admitting: Internal Medicine

## 2020-04-16 ENCOUNTER — Ambulatory Visit (INDEPENDENT_AMBULATORY_CARE_PROVIDER_SITE_OTHER): Payer: BC Managed Care – PPO | Admitting: Internal Medicine

## 2020-04-16 ENCOUNTER — Encounter: Payer: Self-pay | Admitting: Internal Medicine

## 2020-04-16 ENCOUNTER — Other Ambulatory Visit: Payer: Self-pay

## 2020-04-16 VITALS — BP 126/82 | HR 74 | Temp 97.7°F | Ht 71.0 in | Wt 224.0 lb

## 2020-04-16 DIAGNOSIS — E7800 Pure hypercholesterolemia, unspecified: Secondary | ICD-10-CM

## 2020-04-16 DIAGNOSIS — F17209 Nicotine dependence, unspecified, with unspecified nicotine-induced disorders: Secondary | ICD-10-CM | POA: Diagnosis not present

## 2020-04-16 DIAGNOSIS — Z716 Tobacco abuse counseling: Secondary | ICD-10-CM

## 2020-04-16 DIAGNOSIS — I10 Essential (primary) hypertension: Secondary | ICD-10-CM

## 2020-04-16 DIAGNOSIS — N401 Enlarged prostate with lower urinary tract symptoms: Secondary | ICD-10-CM

## 2020-04-16 DIAGNOSIS — Z6831 Body mass index (BMI) 31.0-31.9, adult: Secondary | ICD-10-CM

## 2020-04-16 DIAGNOSIS — Z23 Encounter for immunization: Secondary | ICD-10-CM | POA: Diagnosis not present

## 2020-04-16 DIAGNOSIS — E6609 Other obesity due to excess calories: Secondary | ICD-10-CM

## 2020-04-16 DIAGNOSIS — J449 Chronic obstructive pulmonary disease, unspecified: Secondary | ICD-10-CM

## 2020-04-16 DIAGNOSIS — R3911 Hesitancy of micturition: Secondary | ICD-10-CM

## 2020-04-16 LAB — COMPLETE METABOLIC PANEL WITH GFR
AG Ratio: 2.2 (calc) (ref 1.0–2.5)
ALT: 27 U/L (ref 9–46)
AST: 17 U/L (ref 10–35)
Albumin: 4.3 g/dL (ref 3.6–5.1)
Alkaline phosphatase (APISO): 66 U/L (ref 35–144)
BUN: 24 mg/dL (ref 7–25)
CO2: 26 mmol/L (ref 20–32)
Calcium: 10 mg/dL (ref 8.6–10.3)
Chloride: 109 mmol/L (ref 98–110)
Creat: 0.88 mg/dL (ref 0.70–1.33)
GFR, Est African American: 109 mL/min/{1.73_m2} (ref >=60)
GFR, Est Non African American: 94 mL/min/{1.73_m2} (ref >=60)
Globulin: 2 g/dL (calc) (ref 1.9–3.7)
Glucose, Bld: 97 mg/dL (ref 65–99)
Potassium: 4.2 mmol/L (ref 3.5–5.3)
Sodium: 141 mmol/L (ref 135–146)
Total Bilirubin: 0.4 mg/dL (ref 0.2–1.2)
Total Protein: 6.3 g/dL (ref 6.1–8.1)

## 2020-04-16 LAB — CBC WITH DIFFERENTIAL/PLATELET
Absolute Monocytes: 522 {cells}/uL (ref 200–950)
Basophils Absolute: 48 {cells}/uL (ref 0–200)
Basophils Relative: 0.8 %
Eosinophils Absolute: 312 {cells}/uL (ref 15–500)
Eosinophils Relative: 5.2 %
HCT: 49.9 % (ref 38.5–50.0)
Hemoglobin: 17.5 g/dL — ABNORMAL HIGH (ref 13.2–17.1)
Lymphs Abs: 1920 {cells}/uL (ref 850–3900)
MCH: 31.9 pg (ref 27.0–33.0)
MCHC: 35.1 g/dL (ref 32.0–36.0)
MCV: 91.1 fL (ref 80.0–100.0)
MPV: 10.1 fL (ref 7.5–12.5)
Monocytes Relative: 8.7 %
Neutro Abs: 3198 {cells}/uL (ref 1500–7800)
Neutrophils Relative %: 53.3 %
Platelets: 190 Thousand/uL (ref 140–400)
RBC: 5.48 Million/uL (ref 4.20–5.80)
RDW: 13.1 % (ref 11.0–15.0)
Total Lymphocyte: 32 %
WBC: 6 Thousand/uL (ref 3.8–10.8)

## 2020-04-16 LAB — LIPID PANEL
Cholesterol: 175 mg/dL (ref <200)
HDL: 61 mg/dL (ref >=40)
LDL Cholesterol (Calc): 99 mg/dL (calc)
Non-HDL Cholesterol (Calc): 114 mg/dL (calc) (ref <130)
Total CHOL/HDL Ratio: 2.9 (calc) (ref <5.0)
Triglycerides: 65 mg/dL (ref <150)

## 2020-04-16 MED ORDER — TAMSULOSIN HCL 0.4 MG PO CAPS
0.4000 mg | ORAL_CAPSULE | Freq: Every day | ORAL | 3 refills | Status: DC
Start: 2020-04-16 — End: 2021-04-05

## 2020-04-16 NOTE — Patient Instructions (Signed)
I recommend you get a vaporizer to loosen up your phlegm when it gets tight.   Continue to reduce your cigarettes.

## 2020-04-16 NOTE — Progress Notes (Signed)
Location:  [facility] clinic Provider:  Deavin Forst L. Mariea Clonts, D.O., C.M.D.  Goals of Care:  Advanced Directives 04/16/2020  Does Patient Have a Medical Advance Directive? No  Would patient like information on creating a medical advance directive? No - Patient declined   Chief Complaint  Patient presents with  . Medical Management of Chronic Issues    4 month follow up     HPI: Patient is a 60 y.o. male seen today for medical management of chronic diseases.    He dislocated his hip about a week ago (12/23) when he slipped in front of a 4000lb dolly that almost ran over him.  It popped out and back in.  It took a half hour to get back in. He went home and went to sleep and woke up with it much better.  He had bruising.  He still has some discomfort.  He's used some tylenol for pain.    Blood pressure is good.  Weight is up again.  He almost has to trade cigarettes for food.  He bought one carton in a month b/w he and [name].  He then got two packs and both are sitting.  He thinks they're smoking 1/4 ppd now.  It's helping the checkbook, too.  His withdrawal symptoms are gradually improving.    Thinks he had a kidney stone again where his back hurt and it just away eventually.    He was out of flomax--it took him 20 mins to take a leak b/c there was no flow.  Says his Rx was denied.    Past Medical History:  Diagnosis Date  . Chest pain, unspecified   . Chronic airway obstruction, not elsewhere classified   . Chronic kidney disease    renal and ureteral stones  . Depressive disorder, not elsewhere classified   . Dermatophytosis of the body   . Dyspepsia and other specified disorders of function of stomach   . Headache    frequently, daily   . History of kidney stones   . Obesity, unspecified   . Other abnormal blood chemistry   . Other and unspecified hyperlipidemia   . Other malaise and fatigue   . Overweight(278.02)   . Pain in limb   . Psychosexual dysfunction with inhibited sexual  excitement   . Tobacco use disorder   . Unspecified disorder of skin and subcutaneous tissue   . Unspecified essential hypertension   . Wheezing     Past Surgical History:  Procedure Laterality Date  . ANAL FISSURE REPAIR  1985  . CYSTOSCOPY WITH STENT PLACEMENT Right 04/24/2016   Procedure: CYSTOSCOPY WITH STENT PLACEMENT;  Surgeon: Franchot Gallo, MD;  Location: [facility];  Service: Urology;  Laterality: Right;  . CYSTOSCOPY/RETROGRADE/URETEROSCOPY/STONE EXTRACTION WITH BASKET Right 04/24/2016   Procedure: CYSTOSCOPY/RETROGRADE/URETEROSCOPY/STONE EXTRACTION WITH BASKETAND STENT PLACEMENT;  Surgeon: Franchot Gallo, MD;  Location: [facility];  Service: Urology;  Laterality: Right;  . HOLMIUM LASER APPLICATION Right [id number]   Procedure: HOLMIUM LASER APPLICATION;  Surgeon: Franchot Gallo, MD;  Location: [facility];  Service: Urology;  Laterality: Right;  . right renal stone removed  1983   Dr Serita Butcher    Allergies  Allergen Reactions  . Zoloft [Sertraline Hcl] Other (See Comments)    Lost points in time while taking the medication so weaned himself off of it  . Codeine Itching and Swelling    Outpatient Encounter Medications as of 04/16/2020  Medication Sig  . albuterol (VENTOLIN HFA) 108 (90 Base) MCG/ACT inhaler Inhale  2 puffs into the lungs every 6 (six) hours as needed for wheezing or shortness of breath.  Marland Kitchen amLODipine (NORVASC) 10 MG tablet Take 1 tablet by mouth once daily  . aspirin 81 MG tablet Take 81 mg by mouth daily.   . Metoprolol Succinate 25 MG CS24 Take 25 mg by mouth daily at 6 (six) AM.  . multivitamin (ONE-A-DAY MEN'S) TABS Take 1 tablet by mouth daily.   . Omega-3 Fatty Acids (FISH OIL) 1200 MG CAPS Take 1,200 mg by mouth daily.   . rosuvastatin (CRESTOR) 20 MG tablet Take 1 tablet (20 mg total) by mouth daily.  . [DISCONTINUED] tamsulosin (FLOMAX) 0.4 MG CAPS capsule Take 1 capsule by mouth once daily  . tamsulosin (FLOMAX) 0.4 MG CAPS capsule Take 1 capsule (0.4  mg total) by mouth daily.   No facility-administered encounter medications on file as of 04/16/2020.    Review of Systems:  Review of Systems  Constitutional: Negative for chills, fever and malaise/fatigue.  HENT: Negative for congestion, hearing loss and sore throat.   Eyes: Negative for blurred vision (glasses).  Respiratory: Positive for cough and sputum production. Negative for shortness of breath.        COPD  Cardiovascular: Negative for chest pain, palpitations and leg swelling.  Gastrointestinal: Negative for abdominal pain.  Genitourinary: Negative for dysuria.       Difficulty with stream when off flomax  Musculoskeletal: Positive for back pain. Negative for falls, joint pain and myalgias.  Skin: Negative for itching and rash.  Neurological: Negative for dizziness and loss of consciousness.  Endo/Heme/Allergies: Does not bruise/bleed easily.  Psychiatric/Behavioral: Negative for depression and memory loss. The patient is not nervous/anxious and does not have insomnia.     Health Maintenance  Topic Date Due  . TETANUS/TDAP  04/15/2019  . INFLUENZA VACCINE  11/13/2019  . COVID-19 Vaccine (3 - Booster for Pfizer series) 02/08/2020  . COLONOSCOPY (Pts 45-72yrs Insurance coverage will need to be confirmed)  11/12/2020  . Hepatitis C Screening  Completed  . HIV Screening  Completed    Physical Exam: Vitals:   04/16/20 0827  Weight: 224 lb (101.6 kg)   Body mass index is 31.24 kg/m. Physical Exam Vitals reviewed.  Constitutional:      Appearance: Normal appearance.  Eyes:     Comments: glasses  Cardiovascular:     Rate and Rhythm: Normal rate and regular rhythm.     Pulses: Normal pulses.     Heart sounds: Normal heart sounds.  Pulmonary:     Effort: Pulmonary effort is normal.     Breath sounds: Normal breath sounds. No wheezing, rhonchi or rales.  Abdominal:     General: Bowel sounds are normal.  Musculoskeletal:        General: Normal range of motion.      Right lower leg: No edema.     Left lower leg: No edema.  Skin:    General: Skin is warm and dry.  Neurological:     General: No focal deficit present.     Mental Status: He is alert and oriented to person, place, and time.  Psychiatric:        Mood and Affect: Mood normal.        Behavior: Behavior normal.        Thought Content: Thought content normal.        Judgment: Judgment normal.     Labs reviewed: Basic Metabolic Panel: Recent Labs    08/11/19 0848 12/12/19  1033  NA 140 141  K 4.2 4.2  CL 106 107  CO2 27 27  GLUCOSE 108* 91  BUN 17 17  CREATININE 0.96 0.90  CALCIUM 10.1 9.7   Liver Function Tests: Recent Labs    08/11/19 0848  AST 17  ALT 23  BILITOT 0.4  PROT 5.9*   No results for input(s): LIPASE, AMYLASE in the last 8760 hours. No results for input(s): AMMONIA in the last 8760 hours. CBC: Recent Labs    08/11/19 0848 12/12/19 1033  WBC 5.0 6.1  NEUTROABS 2,445 3,440  HGB 16.8 16.3  HCT 49.1 47.3  MCV 91.1 91.1  PLT 168 177   Lipid Panel: Recent Labs    08/11/19 0848 12/12/19 1033  CHOL 171 162  HDL 57 65  LDLCALC 95 83  TRIG 97 48  CHOLHDL 3.0 2.5   Lab Results  Component Value Date   HGBA1C 5.1 12/12/2019    Procedures since last visit: No results found.  Assessment/Plan 1. Tobacco use disorder, continuous -has reduced to less than 1/4ppd now which is a big improvement--cont to work on this -previously tried meds and did not tolerate -wants to do on his own (with wife actually also trying to stop) -encouraged him for his success thus far and noted difference in lung sounds  2. Need for influenza vaccination - Flu Vaccine QUAD High Dose(Fluad)  3. Tobacco abuse counseling -counseling performed today for more than 3 minutes of the visit  4. Essential hypertension, benign -BP controlled, no changes needed, but must lose his weight he's gained to keep this from becoming an issue again - CBC with Differential/Platelet -  COMPLETE METABOLIC PANEL WITH GFR - Lipid panel  5. Pure hypercholesterolemia - f/u labs next time - Lipid panel  6. Chronic obstructive pulmonary disease, unspecified COPD type ([place]) -ongoing sputum and cough despite smoking reduction (but lungs sound so much better) -encouraged use of vaporizer at home rather than a cigarette when he needs to loosen up the sputum to cough it out, also use albuterol  7. Benign prostatic hyperplasia with urinary hesitancy -sent in flomax and counseled that he should call us if his med is declined and that we like for him to keep a scheduled appt in the future to keep this from being a problem also  8. Body mass index (BMI) of 31.0-31.9 in adult -trended up with winter, decreased activity and decreased smoking--reports it will get better in spring again  9. Class 1 obesity due to excess calories without serious comorbidity with body mass index (BMI) of 31.0 to 31.9 in adult -encouraged more physical activity, healthier diet choices  Labs/tests ordered:   Orders Placed This Encounter  Procedures  . Flu Vaccine QUAD High Dose(Fluad)  . CBC with Differential/Platelet  . COMPLETE METABOLIC PANEL WITH GFR  . Lipid panel    Next appt:  6 mos med mgt, fasting labs before   Tillman Kazmierski L. Kersti Scavone, D.O. [facility] [address], [place] [address] Cell Phone (Mon-Fri 8am-5pm):  [phone] On Call:  [phone] & follow prompts after 5pm & weekends Office Phone:  [phone] Office Fax:  [phone]

## 2020-04-17 ENCOUNTER — Other Ambulatory Visit: Payer: Self-pay

## 2020-04-17 DIAGNOSIS — E7800 Pure hypercholesterolemia, unspecified: Secondary | ICD-10-CM

## 2020-04-17 NOTE — Progress Notes (Signed)
Cholesterol has trended up some so we have two options to manage this:  decreasing sweets, butter, red meat, cheeses that are high in fat or increasing his crestor to the maximum dose of 40mg . Moving more will also help.   I don't want this to deter him from his gradual smoking cessation--he's doing great with it.

## 2020-05-01 ENCOUNTER — Other Ambulatory Visit: Payer: Self-pay | Admitting: *Deleted

## 2020-05-01 ENCOUNTER — Other Ambulatory Visit: Payer: Self-pay | Admitting: Internal Medicine

## 2020-05-01 MED ORDER — ROSUVASTATIN CALCIUM 40 MG PO TABS
40.0000 mg | ORAL_TABLET | Freq: Every day | ORAL | 1 refills | Status: DC
Start: 2020-05-01 — End: 2020-11-01

## 2020-05-01 NOTE — Telephone Encounter (Signed)
Wife called requesting Rx to be sent to pharmacy.

## 2020-06-04 ENCOUNTER — Encounter: Payer: Self-pay | Admitting: Internal Medicine

## 2020-07-01 IMAGING — CR DG HIP (WITH OR WITHOUT PELVIS) 2-3V*L*
3 series · 3 of 3 positions shown · non-contrast
Comparison: None.

CLINICAL DATA: Left hip pain, tenderness and numbness, chronic and
worsening. No known injury. Initial encounter.

EXAM:
DG HIP (WITH OR WITHOUT PELVIS) 2-3V LEFT

[w pelvis upright]
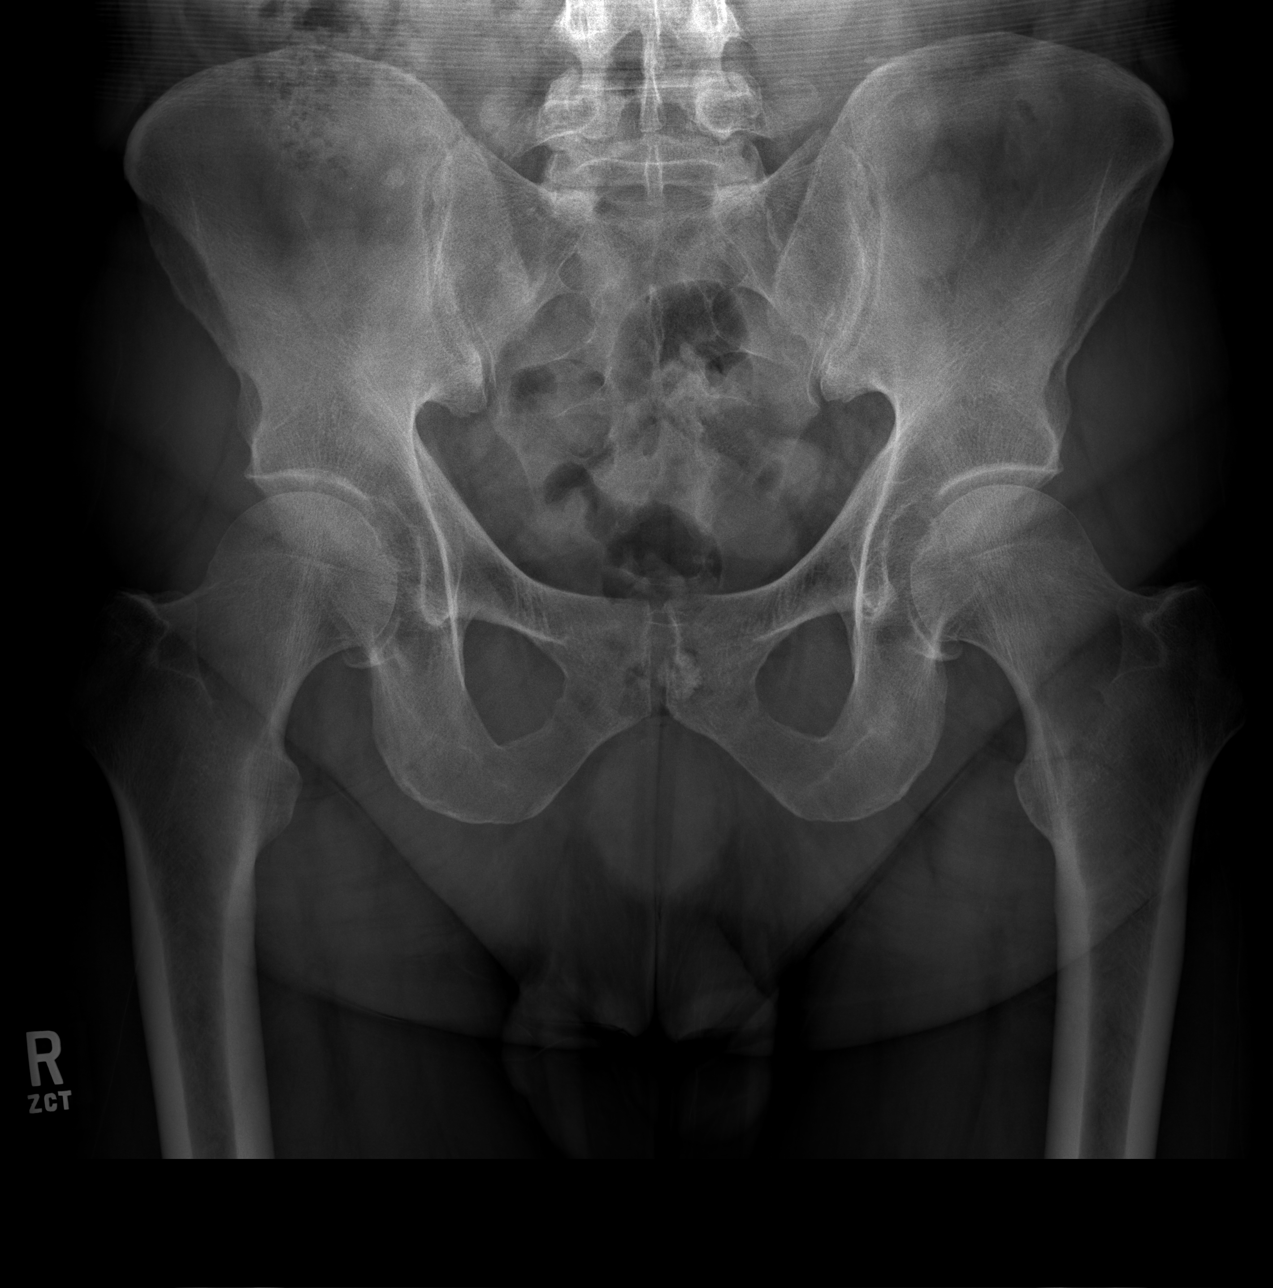

[w hip ap left]
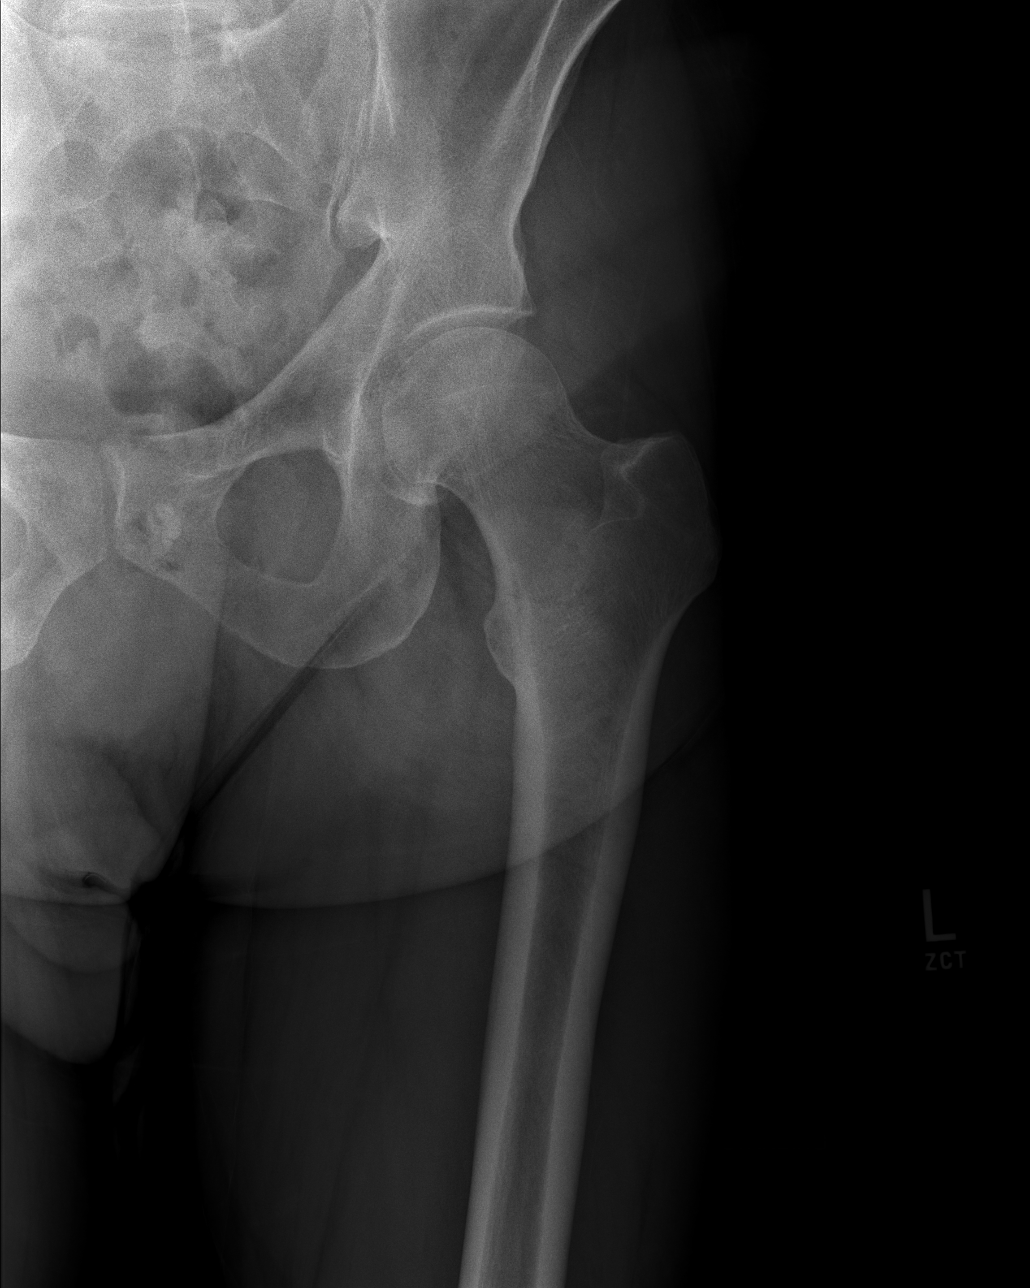

[w hip lat left]
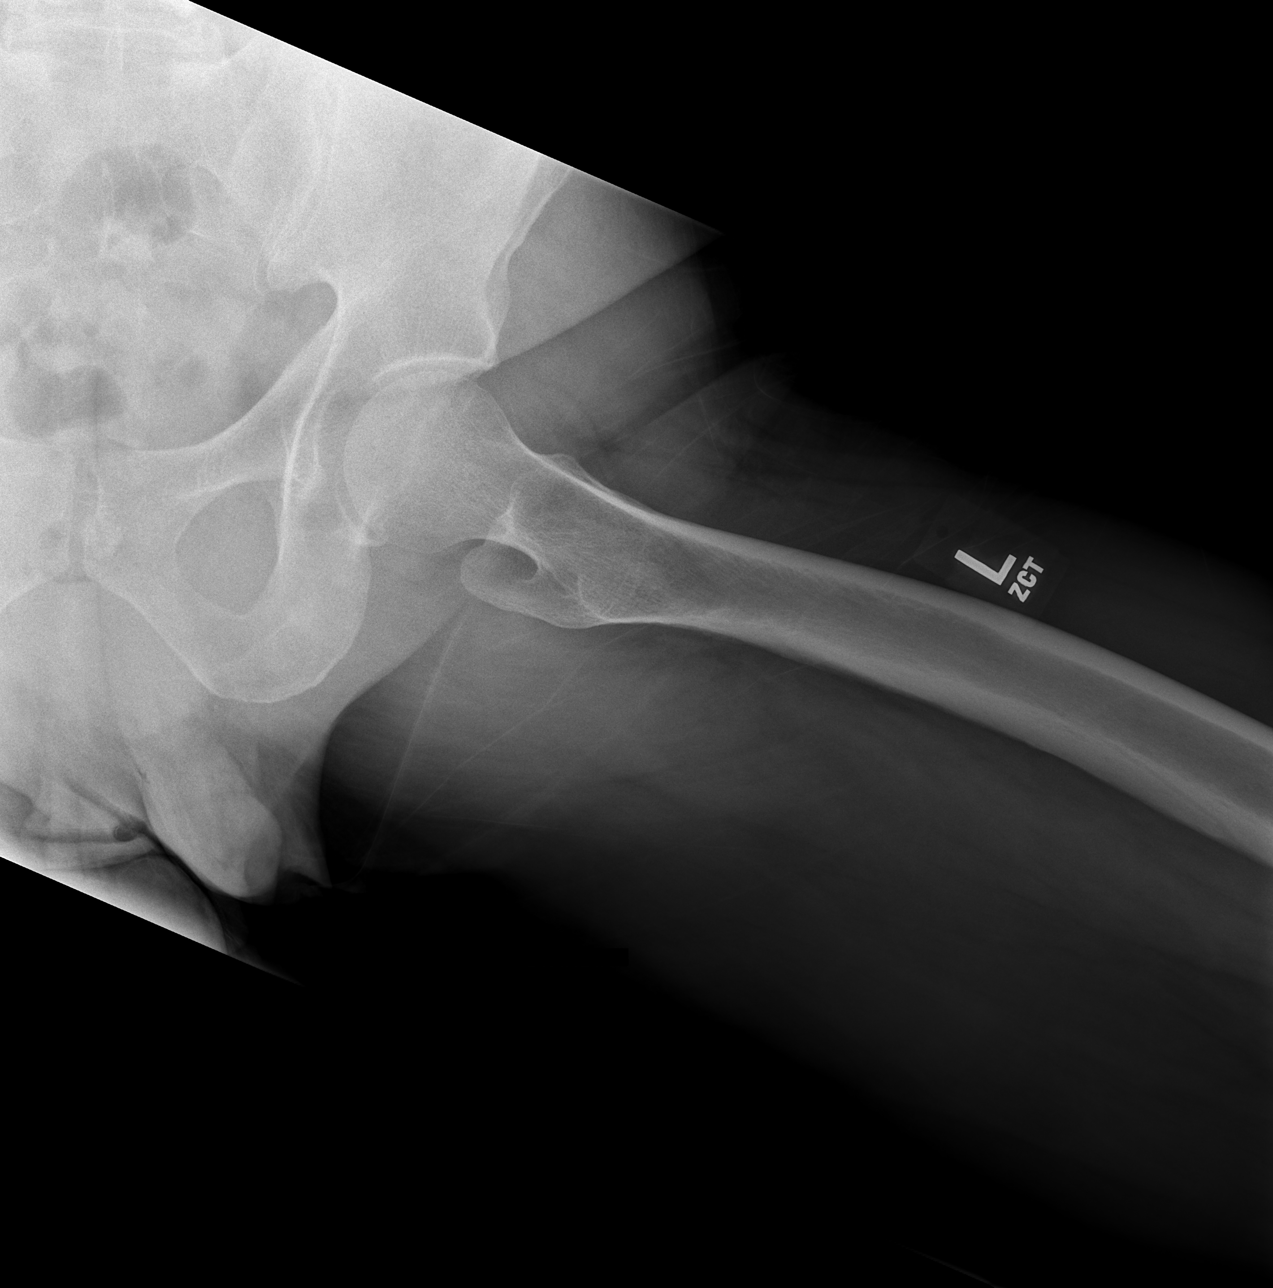

[3 of 3 positions shown; findings below may reference images not displayed]

FINDINGS: There is no evidence of hip fracture or dislocation. There is no
evidence of arthropathy or other focal bone abnormality.
IMPRESSION: Normal exam.

## 2020-07-10 ENCOUNTER — Other Ambulatory Visit: Payer: Self-pay | Admitting: Internal Medicine

## 2020-07-12 ENCOUNTER — Other Ambulatory Visit: Payer: Self-pay | Admitting: Nurse Practitioner

## 2020-07-16 DIAGNOSIS — R0602 Shortness of breath: Secondary | ICD-10-CM | POA: Diagnosis not present

## 2020-07-16 DIAGNOSIS — I340 Nonrheumatic mitral (valve) insufficiency: Secondary | ICD-10-CM | POA: Diagnosis not present

## 2020-07-16 DIAGNOSIS — F17210 Nicotine dependence, cigarettes, uncomplicated: Secondary | ICD-10-CM | POA: Diagnosis not present

## 2020-07-16 DIAGNOSIS — I10 Essential (primary) hypertension: Secondary | ICD-10-CM | POA: Diagnosis not present

## 2020-08-01 ENCOUNTER — Other Ambulatory Visit: Payer: Self-pay | Admitting: Internal Medicine

## 2020-08-13 DIAGNOSIS — R0602 Shortness of breath: Secondary | ICD-10-CM | POA: Diagnosis not present

## 2020-08-15 ENCOUNTER — Other Ambulatory Visit (HOSPITAL_COMMUNITY): Payer: Self-pay | Admitting: Cardiovascular Disease

## 2020-08-15 DIAGNOSIS — I25110 Atherosclerotic heart disease of native coronary artery with unstable angina pectoris: Secondary | ICD-10-CM

## 2020-08-20 ENCOUNTER — Encounter (HOSPITAL_COMMUNITY)
Admission: RE | Admit: 2020-08-20 | Discharge: 2020-08-20 | Disposition: A | Discharge status: Home / Self Care | Payer: BC Managed Care – PPO | Source: Ambulatory Visit | Attending: Cardiovascular Disease | Admitting: Cardiovascular Disease

## 2020-08-20 ENCOUNTER — Other Ambulatory Visit: Payer: Self-pay

## 2020-08-20 DIAGNOSIS — I25110 Atherosclerotic heart disease of native coronary artery with unstable angina pectoris: Secondary | ICD-10-CM

## 2020-08-20 DIAGNOSIS — I200 Unstable angina: Secondary | ICD-10-CM | POA: Diagnosis not present

## 2020-08-20 MED ORDER — TECHNETIUM TC 99M TETROFOSMIN IV KIT
33.0000 | PACK | Freq: Once | INTRAVENOUS | Status: AC | PRN
  Administered 2020-08-20: 33 via INTRAVENOUS

## 2020-08-20 MED ORDER — REGADENOSON 0.4 MG/5ML IV SOLN
INTRAVENOUS | Status: AC
  Administered 2020-08-20: 0.4 mg via INTRAVENOUS
  Filled 2020-08-20: qty 5

## 2020-08-20 MED ORDER — REGADENOSON 0.4 MG/5ML IV SOLN: 0.4000 mg | Freq: Once | INTRAVENOUS | Status: AC

## 2020-08-20 MED ORDER — TECHNETIUM TC 99M TETROFOSMIN IV KIT
10.9000 | PACK | Freq: Once | INTRAVENOUS | Status: AC | PRN
  Administered 2020-08-20: 10.9 via INTRAVENOUS

## 2020-10-22 ENCOUNTER — Ambulatory Visit: Payer: BC Managed Care – PPO | Admitting: Nurse Practitioner

## 2020-10-22 ENCOUNTER — Ambulatory Visit: Payer: 59 | Admitting: Internal Medicine

## 2020-10-31 ENCOUNTER — Other Ambulatory Visit: Payer: Self-pay | Admitting: Nurse Practitioner

## 2020-11-01 ENCOUNTER — Other Ambulatory Visit: Payer: Self-pay | Admitting: *Deleted

## 2020-11-01 MED ORDER — ROSUVASTATIN CALCIUM 40 MG PO TABS: 40.0000 mg | ORAL_TABLET | Freq: Every day | ORAL | 1 refills | Status: DC

## 2020-11-01 NOTE — Telephone Encounter (Signed)
Pharmacy requested refill

## 2020-11-02 ENCOUNTER — Other Ambulatory Visit: Payer: Self-pay | Admitting: *Deleted

## 2020-11-02 MED ORDER — ROSUVASTATIN CALCIUM 40 MG PO TABS: 40.0000 mg | ORAL_TABLET | Freq: Every day | ORAL | 1 refills | Status: DC

## 2020-11-02 NOTE — Telephone Encounter (Signed)
Pharmacy requested refill

## 2020-11-12 DIAGNOSIS — R0602 Shortness of breath: Secondary | ICD-10-CM | POA: Diagnosis not present

## 2020-11-12 DIAGNOSIS — F17210 Nicotine dependence, cigarettes, uncomplicated: Secondary | ICD-10-CM | POA: Diagnosis not present

## 2020-11-12 DIAGNOSIS — I340 Nonrheumatic mitral (valve) insufficiency: Secondary | ICD-10-CM | POA: Diagnosis not present

## 2020-11-12 DIAGNOSIS — I10 Essential (primary) hypertension: Secondary | ICD-10-CM | POA: Diagnosis not present

## 2020-11-19 ENCOUNTER — Ambulatory Visit: Payer: BC Managed Care – PPO | Admitting: Nurse Practitioner

## 2020-12-03 ENCOUNTER — Encounter: Payer: Self-pay | Admitting: Nurse Practitioner

## 2020-12-03 ENCOUNTER — Ambulatory Visit (INDEPENDENT_AMBULATORY_CARE_PROVIDER_SITE_OTHER): Payer: BC Managed Care – PPO | Admitting: Nurse Practitioner

## 2020-12-03 ENCOUNTER — Other Ambulatory Visit: Payer: Self-pay

## 2020-12-03 VITALS — BP 140/98 | HR 83 | Temp 97.1°F | Ht 71.0 in | Wt 218.2 lb

## 2020-12-03 DIAGNOSIS — I10 Essential (primary) hypertension: Secondary | ICD-10-CM

## 2020-12-03 DIAGNOSIS — R58 Hemorrhage, not elsewhere classified: Secondary | ICD-10-CM

## 2020-12-03 DIAGNOSIS — E7800 Pure hypercholesterolemia, unspecified: Secondary | ICD-10-CM | POA: Diagnosis not present

## 2020-12-03 DIAGNOSIS — E6609 Other obesity due to excess calories: Secondary | ICD-10-CM

## 2020-12-03 DIAGNOSIS — N401 Enlarged prostate with lower urinary tract symptoms: Secondary | ICD-10-CM

## 2020-12-03 DIAGNOSIS — J449 Chronic obstructive pulmonary disease, unspecified: Secondary | ICD-10-CM

## 2020-12-03 DIAGNOSIS — R3911 Hesitancy of micturition: Secondary | ICD-10-CM

## 2020-12-03 DIAGNOSIS — F17209 Nicotine dependence, unspecified, with unspecified nicotine-induced disorders: Secondary | ICD-10-CM

## 2020-12-03 DIAGNOSIS — Z6830 Body mass index (BMI) 30.0-30.9, adult: Secondary | ICD-10-CM

## 2020-12-03 LAB — COMPLETE METABOLIC PANEL WITH GFR
AG Ratio: 2.1 (calc) (ref 1.0–2.5)
ALT: 23 U/L (ref 9–46)
AST: 16 U/L (ref 10–35)
Albumin: 4.4 g/dL (ref 3.6–5.1)
Alkaline phosphatase (APISO): 67 U/L (ref 35–144)
BUN: 17 mg/dL (ref 7–25)
CO2: 27 mmol/L (ref 20–32)
Calcium: 10 mg/dL (ref 8.6–10.3)
Chloride: 109 mmol/L (ref 98–110)
Creat: 0.86 mg/dL (ref 0.70–1.35)
Globulin: 2.1 g/dL (calc) (ref 1.9–3.7)
Glucose, Bld: 91 mg/dL (ref 65–99)
Potassium: 4 mmol/L (ref 3.5–5.3)
Sodium: 142 mmol/L (ref 135–146)
Total Bilirubin: 0.4 mg/dL (ref 0.2–1.2)
Total Protein: 6.5 g/dL (ref 6.1–8.1)
eGFR: 99 mL/min/{1.73_m2} (ref >=60)

## 2020-12-03 LAB — CBC WITH DIFFERENTIAL/PLATELET
Absolute Monocytes: 538 cells/uL (ref 200–950)
Basophils Absolute: 28 cells/uL (ref 0–200)
Basophils Relative: 0.4 %
Eosinophils Absolute: 242 cells/uL (ref 15–500)
Eosinophils Relative: 3.5 %
HCT: 48.8 % (ref 38.5–50.0)
Hemoglobin: 16.5 g/dL (ref 13.2–17.1)
Lymphs Abs: 2305 cells/uL (ref 850–3900)
MCH: 30.7 pg (ref 27.0–33.0)
MCHC: 33.8 g/dL (ref 32.0–36.0)
MCV: 90.7 fL (ref 80.0–100.0)
MPV: 9.9 fL (ref 7.5–12.5)
Monocytes Relative: 7.8 %
Neutro Abs: 3788 cells/uL (ref 1500–7800)
Neutrophils Relative %: 54.9 %
Platelets: 186 10*3/uL (ref 140–400)
RBC: 5.38 10*6/uL (ref 4.20–5.80)
RDW: 13.4 % (ref 11.0–15.0)
Total Lymphocyte: 33.4 %
WBC: 6.9 10*3/uL (ref 3.8–10.8)

## 2020-12-03 LAB — LIPID PANEL
Cholesterol: 157 mg/dL (ref <200)
HDL: 61 mg/dL (ref >=40)
LDL Cholesterol (Calc): 81 mg/dL (calc)
Non-HDL Cholesterol (Calc): 96 mg/dL (calc) (ref <130)
Total CHOL/HDL Ratio: 2.6 (calc) (ref <5.0)
Triglycerides: 69 mg/dL (ref <150)

## 2020-12-03 NOTE — Patient Instructions (Addendum)
Need to get TDAP, COVID booster, flu vaccine at local pharmacy  Need follow up colonoscopy- call Dr Benson Norway to schedule

## 2020-12-03 NOTE — Progress Notes (Signed)
Careteam: Patient Care Team: Lauree Chandler, NP as PCP - General (Geriatric Medicine) Carol Ada, MD as Consulting Physician (Gastroenterology) Franchot Gallo, MD as Consulting Physician (Urology)  PLACE OF SERVICE:  [place] Directive information Does Patient Have a Medical Advance Directive?: No  Allergies  Allergen Reactions   Zoloft [Sertraline Hcl] Other (See Comments)    Lost points in time while taking the medication so weaned himself off of it   Codeine Itching and Swelling    Chief Complaint  Patient presents with   Medical Management of Chronic Issues    6 month follow up.Patient would like to discuss area on left arm   Health Maintenance     HPI: Patient is a 60 y.o. male for routine follow up.  Smoking- has cut back to 1/2 pack a week. Can not totally quit. He has to have one with his coffee. Tried wellbutrin in the past but did not help.   Htn- question if he took his blood pressure medication last night.  Does not generally take bp at home but bp is well controlled unless he forgets medication  Copd- uses albuterol PRN. Only needs in the spring.   Bph- doing well on flomax  Hyperlipidemia- on crestor 40 mg. For the most part attempts to eat healthy.   Continues to work.  He has active lifestyle.   Has area of bruising on left arm. No pain, swelling or erythema.   Review of Systems:  Review of Systems  Constitutional:  Negative for chills, fever and weight loss.  HENT:  Negative for tinnitus.   Respiratory:  Negative for cough, sputum production and shortness of breath.   Cardiovascular:  Negative for chest pain, palpitations and leg swelling.  Gastrointestinal:  Negative for abdominal pain, constipation, diarrhea and heartburn.  Genitourinary:  Negative for dysuria, frequency and urgency.  Musculoskeletal:  Negative for back pain, falls, joint pain and myalgias.  Skin: Negative.   Neurological:  Negative for dizziness  and headaches.  Psychiatric/Behavioral:  Negative for depression and memory loss. The patient does not have insomnia.    Past Medical History:  Diagnosis Date   Chest pain, unspecified    Chronic airway obstruction, not elsewhere classified    Chronic kidney disease    renal and ureteral stones   Depressive disorder, not elsewhere classified    Dermatophytosis of the body    Dyspepsia and other specified disorders of function of stomach    Headache    frequently, daily    History of kidney stones    Obesity, unspecified    Other abnormal blood chemistry    Other and unspecified hyperlipidemia    Other malaise and fatigue    Overweight(278.02)    Pain in limb    Psychosexual dysfunction with inhibited sexual excitement    Tobacco use disorder    Unspecified disorder of skin and subcutaneous tissue    Unspecified essential hypertension    Wheezing    Past Surgical History:  Procedure Laterality Date   ANAL FISSURE REPAIR  1985   CYSTOSCOPY WITH STENT PLACEMENT Right 04/24/2016   Procedure: CYSTOSCOPY WITH STENT PLACEMENT;  Surgeon: Franchot Gallo, MD;  Location: [facility];  Service: Urology;  Laterality: Right;   CYSTOSCOPY/RETROGRADE/URETEROSCOPY/STONE EXTRACTION WITH BASKET Right 04/24/2016   Procedure: CYSTOSCOPY/RETROGRADE/URETEROSCOPY/STONE EXTRACTION WITH BASKETAND STENT PLACEMENT;  Surgeon: Franchot Gallo, MD;  Location: [facility];  Service: Urology;  Laterality: Right;   HOLMIUM LASER APPLICATION Right 07/21/8117   Procedure: HOLMIUM  LASER APPLICATION;  Surgeon: Franchot Gallo, MD;  Location: [facility];  Service: Urology;  Laterality: Right;   right renal stone removed  1983   Dr Serita Butcher   Social History:   reports that he has been smoking cigarettes. He has a 20.00 pack-year smoking history. He has never used smokeless tobacco. He reports current alcohol use. He reports that he does not use drugs.  Family History  Problem Relation Age of Onset   Diabetes Father     Stroke Father    Stroke Sister    Diabetes Sister     Medications: Patient's Medications  New Prescriptions   No medications on file  Previous Medications   ALBUTEROL (VENTOLIN HFA) 108 (90 BASE) MCG/ACT INHALER    Inhale 2 puffs into the lungs every 6 (six) hours as needed for wheezing or shortness of breath.   AMLODIPINE (NORVASC) 10 MG TABLET    Take 1 tablet by mouth once daily   ASPIRIN 81 MG TABLET    Take 81 mg by mouth daily.    GARLIC PO    Take by mouth.   METOPROLOL SUCCINATE 25 MG CS24    Take 25 mg by mouth daily at 6 (six) AM.   MULTIVITAMIN (ONE-A-DAY MEN'S) TABS    Take 1 tablet by mouth daily.    OMEGA-3 FATTY ACIDS (FISH OIL) 1200 MG CAPS    Take 1,200 mg by mouth daily.    ROSUVASTATIN (CRESTOR) 40 MG TABLET    Take 1 tablet (40 mg total) by mouth daily.   TAMSULOSIN (FLOMAX) 0.4 MG CAPS CAPSULE    Take 1 capsule (0.4 mg total) by mouth daily.  Modified Medications   No medications on file  Discontinued Medications   No medications on file    Physical Exam:  Vitals:   12/03/20 1151  BP: (!) 140/98  Pulse: 83  Temp: (!) 97.1 F (36.2 C)  TempSrc: Temporal  SpO2: 98%  Weight: 218 lb 3.2 oz (99 kg)  Height: 5' 11"  (1.803 m)   Body mass index is 30.43 kg/m. Wt Readings from Last 3 Encounters:  12/03/20 218 lb 3.2 oz (99 kg)  04/16/20 224 lb (101.6 kg)  12/12/19 215 lb (97.5 kg)    Physical Exam Constitutional:      General: He is not in acute distress.    Appearance: He is well-developed. He is not diaphoretic.  HENT:     Head: Normocephalic and atraumatic.     Right Ear: External ear normal.     Left Ear: External ear normal.     Mouth/Throat:     Pharynx: No oropharyngeal exudate.  Eyes:     Conjunctiva/sclera: Conjunctivae normal.     Pupils: Pupils are equal, round, and reactive to light.  Cardiovascular:     Rate and Rhythm: Normal rate and regular rhythm.     Heart sounds: Normal heart sounds.  Pulmonary:     Effort: Pulmonary effort  is normal.     Breath sounds: Normal breath sounds.  Abdominal:     General: Bowel sounds are normal.     Palpations: Abdomen is soft.  Musculoskeletal:        General: No tenderness.     Cervical back: Normal range of motion and neck supple.     Right lower leg: No edema.     Left lower leg: No edema.  Skin:    General: Skin is warm and dry.  Neurological:     Mental Status: He  is alert and oriented to person, place, and time. Mental status is at baseline.  Psychiatric:        Mood and Affect: Mood normal.    Labs reviewed: Basic Metabolic Panel: Recent Labs    12/12/19 1033 04/16/20 0907  NA 141 141  K 4.2 4.2  CL 107 109  CO2 27 26  GLUCOSE 91 97  BUN 17 24  CREATININE 0.90 0.88  CALCIUM 9.7 10.0   Liver Function Tests: Recent Labs    04/16/20 0907  AST 17  ALT 27  BILITOT 0.4  PROT 6.3   No results for input(s): LIPASE, AMYLASE in the last 8760 hours. No results for input(s): AMMONIA in the last 8760 hours. CBC: Recent Labs    12/12/19 1033 04/16/20 0907  WBC 6.1 6.0  NEUTROABS 3,440 3,198  HGB 16.3 17.5*  HCT 47.3 49.9  MCV 91.1 91.1  PLT 177 190   Lipid Panel: Recent Labs    12/12/19 1033 04/16/20 0907  CHOL 162 175  HDL 65 61  LDLCALC 83 99  TRIG 48 65  CHOLHDL 2.5 2.9   TSH: No results for input(s): TSH in the last 8760 hours. A1C: Lab Results  Component Value Date   HGBA1C 5.1 12/12/2019     Assessment/Plan 1. Pure hypercholesterolemia -continues on crestor with dietary modifications. - Lipid panel - CMP with eGFR([facility])  2. Essential hypertension, benign -Blood pressure elevated today, but typically well controlled -question if he did not take medication last night. -No changes to medications today  -will have pt continue to monitor home bp goal <867/61 -follow metabolic panel - CMP with eGFR([facility]) - CBC with Differential/Platelet  3. Ecchymosis Due to taking ASA. Reassurance provided  4. Tobacco use disorder,  continuous Education provided on cessation  5. Chronic obstructive pulmonary disease, unspecified COPD type ([place]) -The current medical regimen is effective;  continue present plan and medications. Encouraged smoking cessation.  6. Benign prostatic hyperplasia with urinary hesitancy Stable on flomax  7. Class 1 obesity due to excess calories without serious comorbidity with body mass index (BMI) of 30.0 to 30.9 in adult -education provided on healthy weight loss through increase in physical activity and proper nutrition     Next appt: 6 months. Labs at appt. Carlos American. [place], [facility] [phone]

## 2021-01-30 ENCOUNTER — Other Ambulatory Visit: Payer: Self-pay | Admitting: Nurse Practitioner

## 2021-04-05 ENCOUNTER — Other Ambulatory Visit: Payer: Self-pay | Admitting: *Deleted

## 2021-04-05 MED ORDER — TAMSULOSIN HCL 0.4 MG PO CAPS: 0.4000 mg | ORAL_CAPSULE | Freq: Every day | ORAL | 3 refills | Status: DC

## 2021-04-05 NOTE — Telephone Encounter (Signed)
[facility] requested refill.

## 2021-05-01 ENCOUNTER — Other Ambulatory Visit: Payer: Self-pay | Admitting: Nurse Practitioner

## 2021-05-20 DIAGNOSIS — E663 Overweight: Secondary | ICD-10-CM | POA: Diagnosis not present

## 2021-05-20 DIAGNOSIS — I10 Essential (primary) hypertension: Secondary | ICD-10-CM | POA: Diagnosis not present

## 2021-05-20 DIAGNOSIS — R0602 Shortness of breath: Secondary | ICD-10-CM | POA: Diagnosis not present

## 2021-05-20 DIAGNOSIS — H8113 Benign paroxysmal vertigo, bilateral: Secondary | ICD-10-CM | POA: Diagnosis not present

## 2021-06-10 ENCOUNTER — Ambulatory Visit: Payer: BC Managed Care – PPO | Admitting: Nurse Practitioner

## 2021-07-15 ENCOUNTER — Ambulatory Visit: Payer: BC Managed Care – PPO | Admitting: Nurse Practitioner

## 2021-07-15 ENCOUNTER — Encounter: Payer: Self-pay | Admitting: Nurse Practitioner

## 2021-07-15 VITALS — BP 118/74 | HR 84 | Temp 97.1°F | Ht 71.0 in | Wt 219.0 lb

## 2021-07-15 DIAGNOSIS — N401 Enlarged prostate with lower urinary tract symptoms: Secondary | ICD-10-CM | POA: Diagnosis not present

## 2021-07-15 DIAGNOSIS — E7800 Pure hypercholesterolemia, unspecified: Secondary | ICD-10-CM | POA: Diagnosis not present

## 2021-07-15 DIAGNOSIS — I10 Essential (primary) hypertension: Secondary | ICD-10-CM

## 2021-07-15 DIAGNOSIS — J449 Chronic obstructive pulmonary disease, unspecified: Secondary | ICD-10-CM

## 2021-07-15 DIAGNOSIS — F17209 Nicotine dependence, unspecified, with unspecified nicotine-induced disorders: Secondary | ICD-10-CM

## 2021-07-15 DIAGNOSIS — R3911 Hesitancy of micturition: Secondary | ICD-10-CM

## 2021-07-15 LAB — COMPLETE METABOLIC PANEL WITH GFR
AG Ratio: 2.1 (calc) (ref 1.0–2.5)
ALT: 19 U/L (ref 9–46)
AST: 17 U/L (ref 10–35)
Albumin: 4.2 g/dL (ref 3.6–5.1)
Alkaline phosphatase (APISO): 67 U/L (ref 35–144)
BUN: 24 mg/dL (ref 7–25)
CO2: 27 mmol/L (ref 20–32)
Calcium: 9.8 mg/dL (ref 8.6–10.3)
Chloride: 109 mmol/L (ref 98–110)
Creat: 1 mg/dL (ref 0.70–1.35)
Globulin: 2 g/dL (calc) (ref 1.9–3.7)
Glucose, Bld: 99 mg/dL (ref 65–99)
Potassium: 4.1 mmol/L (ref 3.5–5.3)
Sodium: 142 mmol/L (ref 135–146)
Total Bilirubin: 0.4 mg/dL (ref 0.2–1.2)
Total Protein: 6.2 g/dL (ref 6.1–8.1)
eGFR: 86 mL/min/{1.73_m2} (ref >=60)

## 2021-07-15 LAB — LIPID PANEL
Cholesterol: 149 mg/dL (ref <200)
HDL: 56 mg/dL (ref >=40)
LDL Cholesterol (Calc): 79 mg/dL (calc)
Non-HDL Cholesterol (Calc): 93 mg/dL (calc) (ref <130)
Total CHOL/HDL Ratio: 2.7 (calc) (ref <5.0)
Triglycerides: 56 mg/dL (ref <150)

## 2021-07-15 LAB — CBC WITH DIFFERENTIAL/PLATELET
Absolute Monocytes: 624 cells/uL (ref 200–950)
Basophils Absolute: 32 cells/uL (ref 0–200)
Basophils Relative: 0.4 %
Eosinophils Absolute: 198 cells/uL (ref 15–500)
Eosinophils Relative: 2.5 %
HCT: 46.9 % (ref 38.5–50.0)
Hemoglobin: 15.8 g/dL (ref 13.2–17.1)
Lymphs Abs: 2173 cells/uL (ref 850–3900)
MCH: 30.6 pg (ref 27.0–33.0)
MCHC: 33.7 g/dL (ref 32.0–36.0)
MCV: 90.9 fL (ref 80.0–100.0)
MPV: 9.8 fL (ref 7.5–12.5)
Monocytes Relative: 7.9 %
Neutro Abs: 4874 cells/uL (ref 1500–7800)
Neutrophils Relative %: 61.7 %
Platelets: 196 10*3/uL (ref 140–400)
RBC: 5.16 10*6/uL (ref 4.20–5.80)
RDW: 13.2 % (ref 11.0–15.0)
Total Lymphocyte: 27.5 %
WBC: 7.9 10*3/uL (ref 3.8–10.8)

## 2021-07-15 NOTE — Patient Instructions (Addendum)
Go ahead and quit smoking :) ? ?Start zyrtec (can use generic)  10 mg daily during pollen season  ? ?Call Dr Benson Norway and reschedule COLONOSCOPY  ? ? ?COVID, shingles and TDAP vaccines are due at your local pharmacy  ?

## 2021-07-15 NOTE — Progress Notes (Signed)
? ? ?Careteam: ?Patient Care Team: ?Lauree Chandler, NP as PCP - General (Geriatric Medicine) ?Carol Ada, MD as Consulting Physician (Gastroenterology) ?Franchot Gallo, MD as Consulting Physician (Urology) ? ?PLACE OF SERVICE:  ?[facility]  ?Advanced Directive information ?Does Patient Have a Medical Advance Directive?: No, Would patient like information on creating a medical advance directive?: Yes (MAU/Ambulatory/Procedural Areas - Information given) ? ?Allergies  ?Allergen Reactions  ? Zoloft [Sertraline Hcl] Other (See Comments)  ?  Lost points in time while taking the medication so weaned himself off of it  ? Codeine Itching and Swelling  ? ? ?Chief Complaint  ?Patient presents with  ? Medical Management of Chronic Issues  ?  6 month follow-up. Discuss need for shingrix, td/tdap, covid booster, and colonoscopy or post pone/exclude if patient refuses. NCIR verified. Moderate fall risk. Patient c/oi using rescue inhaler more than usual   ? ? ? ?HPI: Patient is a 60 y.o. male for routine follow up.  ? ?Hypertension- well controlled, took BP medication at 4 am.  ?Has not had coffee this morning.  ? ?Continues to smoke- has cut back more. Maybe a pack a month. Trying to quit for his wife.  ? ?COPD- using albuterol every morning over the last week, Feels good once he takes albuterol but has a hard time breathing without. Has not taken this morning. Gets worse with COPD during pollen season. Otherwise will not need albuterol.  ? ?BPH- has a hard time urinating without medication.  ? ?Hyperlipidemia- continues on crestor.  ? ?Reports he weighs less at home ?Reports active lifestyle at work, does not exercise.  ? ?Review of Systems:  ?Review of Systems  ?Constitutional:  Negative for chills, fever and weight loss.  ?HENT:  Negative for tinnitus.   ?Respiratory:  Negative for cough, sputum production and shortness of breath.   ?Cardiovascular:  Negative for chest pain, palpitations and leg swelling.   ?Gastrointestinal:  Negative for abdominal pain, constipation, diarrhea and heartburn.  ?Genitourinary:  Negative for dysuria, frequency and urgency.  ?Musculoskeletal:  Negative for back pain, falls, joint pain and myalgias.  ?Skin: Negative.   ?Neurological:  Negative for dizziness and headaches.  ?Endo/Heme/Allergies:  Positive for environmental allergies.  ?Psychiatric/Behavioral:  Negative for depression and memory loss. The patient does not have insomnia.  = ? ?Past Medical History:  ?Diagnosis Date  ? Chest pain, unspecified   ? Chronic airway obstruction, not elsewhere classified   ? Chronic kidney disease   ? renal and ureteral stones  ? Depressive disorder, not elsewhere classified   ? Dermatophytosis of the body   ? Dyspepsia and other specified disorders of function of stomach   ? Headache   ? frequently, daily   ? History of kidney stones   ? Obesity, unspecified   ? Other abnormal blood chemistry   ? Other and unspecified hyperlipidemia   ? Other malaise and fatigue   ? Overweight(278.02)   ? Pain in limb   ? Psychosexual dysfunction with inhibited sexual excitement   ? Tobacco use disorder   ? Unspecified disorder of skin and subcutaneous tissue   ? Unspecified essential hypertension   ? Wheezing   ? ?Past Surgical History:  ?Procedure Laterality Date  ? [place]  ? CYSTOSCOPY WITH STENT PLACEMENT Right 04/24/2016  ? Procedure: CYSTOSCOPY WITH STENT PLACEMENT;  Surgeon: Franchot Gallo, MD;  Location: [facility];  Service: Urology;  Laterality: Right;  ? CYSTOSCOPY/RETROGRADE/URETEROSCOPY/STONE EXTRACTION WITH BASKET Right 04/24/2016  ?  Procedure: CYSTOSCOPY/RETROGRADE/URETEROSCOPY/STONE EXTRACTION WITH BASKETAND STENT PLACEMENT;  Surgeon: Franchot Gallo, MD;  Location: [facility];  Service: Urology;  Laterality: Right;  ? HOLMIUM LASER APPLICATION Right 6/83/4196  ? Procedure: HOLMIUM LASER APPLICATION;  Surgeon: Franchot Gallo, MD;  Location: [facility];  Service: Urology;  Laterality:  Right;  ? right renal stone removed  1983  ? Dr Serita Butcher  ? ?Social History: ?  reports that he has been smoking cigarettes. He has a 20.00 pack-year smoking history. He has never used smokeless tobacco. He reports current alcohol use. He reports that he does not use drugs. ? ?Family History  ?Problem Relation Age of Onset  ? Diabetes Father   ? Stroke Father   ? Stroke Sister   ? Diabetes Sister   ? ? ?Medications: ?Patient's Medications  ?New Prescriptions  ? No medications on file  ?Previous Medications  ? ALBUTEROL (VENTOLIN HFA) 108 (90 BASE) MCG/ACT INHALER    Inhale 2 puffs into the lungs every 6 (six) hours as needed for wheezing or shortness of breath.  ? AMLODIPINE (NORVASC) 10 MG TABLET    Take 1 tablet by mouth once daily  ? ASPIRIN 81 MG TABLET    Take 81 mg by mouth daily.   ? GARLIC PO    Take by mouth.  ? METOPROLOL SUCCINATE 25 MG CS24    Take 25 mg by mouth daily at 6 (six) AM.  ? MULTIVITAMIN (ONE-A-DAY MEN'S) TABS    Take 1 tablet by mouth daily.   ? OMEGA-3 FATTY ACIDS (FISH OIL) 1200 MG CAPS    Take 1,200 mg by mouth daily.   ? ROSUVASTATIN (CRESTOR) 40 MG TABLET    Take 1 tablet (40 mg total) by mouth daily.  ? TAMSULOSIN (FLOMAX) 0.4 MG CAPS CAPSULE    Take 1 capsule (0.4 mg total) by mouth daily.  ?Modified Medications  ? No medications on file  ?Discontinued Medications  ? No medications on file  ? ? ?Physical Exam: ? ?Vitals:  ? 07/15/21 0851  ?BP: 118/74  ?Pulse: 84  ?Temp: (!) 97.1 ?F (36.2 ?C)  ?TempSrc: Temporal  ?SpO2: 99%  ?Weight: 219 lb (99.3 kg)  ?Height: '5\' 11"'$  (1.803 m)  ? ?Body mass index is 30.54 kg/m?. ?Wt Readings from Last 3 Encounters:  ?07/15/21 219 lb (99.3 kg)  ?12/03/20 218 lb 3.2 oz (99 kg)  ?04/16/20 224 lb (101.6 kg)  ? ? ?Physical Exam ?Constitutional:   ?   General: He is not in acute distress. ?   Appearance: He is well-developed. He is not diaphoretic.  ?HENT:  ?   Head: Normocephalic and atraumatic.  ?   Right Ear: External ear normal.  ?   Left Ear: External  ear normal.  ?   Mouth/Throat:  ?   Pharynx: No oropharyngeal exudate.  ?Eyes:  ?   Conjunctiva/sclera: Conjunctivae normal.  ?   Pupils: Pupils are equal, round, and reactive to light.  ?Cardiovascular:  ?   Rate and Rhythm: Normal rate and regular rhythm.  ?   Heart sounds: Normal heart sounds.  ?Pulmonary:  ?   Effort: Pulmonary effort is normal.  ?   Breath sounds: Normal breath sounds.  ?Abdominal:  ?   General: Bowel sounds are normal.  ?   Palpations: Abdomen is soft.  ?Musculoskeletal:     ?   General: No tenderness.  ?   Cervical back: Normal range of motion and neck supple.  ?   Right knee: Crepitus present.  ?  Left knee: Crepitus present.  ?   Right lower leg: No edema.  ?   Left lower leg: No edema.  ?Skin: ?   General: Skin is warm and dry.  ?Neurological:  ?   Mental Status: He is alert and oriented to person, place, and time.  ? ? ?Labs reviewed: ?Basic Metabolic Panel: ?Recent Labs  ?  12/03/20 ?1219  ?NA 142  ?K 4.0  ?CL 109  ?CO2 27  ?GLUCOSE 91  ?BUN 17  ?CREATININE 0.86  ?CALCIUM 10.0  ? ?Liver Function Tests: ?Recent Labs  ?  12/03/20 ?1219  ?AST 16  ?ALT 23  ?BILITOT 0.4  ?PROT 6.5  ? ?No results for input(s): LIPASE, AMYLASE in the last 8760 hours. ?No results for input(s): AMMONIA in the last 8760 hours. ?CBC: ?Recent Labs  ?  12/03/20 ?1219  ?WBC 6.9  ?NEUTROABS 3,788  ?HGB 16.5  ?HCT 48.8  ?MCV 90.7  ?PLT 186  ? ?Lipid Panel: ?Recent Labs  ?  12/03/20 ?1219  ?CHOL 157  ?HDL 61  ?[place] 81  ?TRIG 69  ?CHOLHDL 2.6  ? ?TSH: ?No results for input(s): TSH in the last 8760 hours. ?A1C: ?Lab Results  ?Component Value Date  ? HGBA1C 5.1 12/12/2019  ? ? ? ?Assessment/Plan ?1. Benign prostatic hyperplasia with urinary hesitancy ?-controlled on flomax.  ? ?2. Chronic obstructive pulmonary disease, unspecified COPD type ([place]) ?-some worsening of symptoms during pollen season, encouraged to use zytec daily ?-he is using albuterol more frequently at this time but states this is generally temporary,  will notify if worsening of symptoms.  ? ?3. Tobacco use disorder, continuous ?-plans to quit which has been encouraged. ? ?4. Pure hypercholesterolemia ?-cholesterol at goal. Continue current regimen, will follo

## 2021-10-23 ENCOUNTER — Other Ambulatory Visit: Payer: Self-pay | Admitting: Nurse Practitioner

## 2021-10-28 DIAGNOSIS — R072 Precordial pain: Secondary | ICD-10-CM | POA: Diagnosis not present

## 2021-10-28 DIAGNOSIS — R0602 Shortness of breath: Secondary | ICD-10-CM | POA: Diagnosis not present

## 2021-10-28 DIAGNOSIS — E663 Overweight: Secondary | ICD-10-CM | POA: Diagnosis not present

## 2021-10-28 DIAGNOSIS — I10 Essential (primary) hypertension: Secondary | ICD-10-CM | POA: Diagnosis not present

## 2022-01-20 ENCOUNTER — Encounter: Payer: Self-pay | Admitting: Nurse Practitioner

## 2022-01-20 ENCOUNTER — Ambulatory Visit: Payer: BC Managed Care – PPO | Admitting: Nurse Practitioner

## 2022-01-20 VITALS — BP 146/100 | HR 80 | Temp 95.9°F | Ht 71.0 in | Wt 220.6 lb

## 2022-01-20 DIAGNOSIS — F17209 Nicotine dependence, unspecified, with unspecified nicotine-induced disorders: Secondary | ICD-10-CM | POA: Diagnosis not present

## 2022-01-20 DIAGNOSIS — I10 Essential (primary) hypertension: Secondary | ICD-10-CM

## 2022-01-20 DIAGNOSIS — R3911 Hesitancy of micturition: Secondary | ICD-10-CM

## 2022-01-20 DIAGNOSIS — E7800 Pure hypercholesterolemia, unspecified: Secondary | ICD-10-CM

## 2022-01-20 DIAGNOSIS — Z23 Encounter for immunization: Secondary | ICD-10-CM

## 2022-01-20 DIAGNOSIS — J449 Chronic obstructive pulmonary disease, unspecified: Secondary | ICD-10-CM

## 2022-01-20 DIAGNOSIS — N401 Enlarged prostate with lower urinary tract symptoms: Secondary | ICD-10-CM

## 2022-01-20 DIAGNOSIS — B351 Tinea unguium: Secondary | ICD-10-CM

## 2022-01-20 LAB — CBC WITH DIFFERENTIAL/PLATELET
Absolute Monocytes: 583 cells/uL (ref 200–950)
Basophils Absolute: 40 cells/uL (ref 0–200)
Basophils Relative: 0.6 %
Eosinophils Absolute: 174 cells/uL (ref 15–500)
Eosinophils Relative: 2.6 %
HCT: 48.3 % (ref 38.5–50.0)
Hemoglobin: 17 g/dL (ref 13.2–17.1)
Lymphs Abs: 1930 cells/uL (ref 850–3900)
MCH: 31.4 pg (ref 27.0–33.0)
MCHC: 35.2 g/dL (ref 32.0–36.0)
MCV: 89.3 fL (ref 80.0–100.0)
MPV: 10 fL (ref 7.5–12.5)
Monocytes Relative: 8.7 %
Neutro Abs: 3973 cells/uL (ref 1500–7800)
Neutrophils Relative %: 59.3 %
Platelets: 183 10*3/uL (ref 140–400)
RBC: 5.41 10*6/uL (ref 4.20–5.80)
RDW: 13 % (ref 11.0–15.0)
Total Lymphocyte: 28.8 %
WBC: 6.7 10*3/uL (ref 3.8–10.8)

## 2022-01-20 LAB — COMPLETE METABOLIC PANEL WITH GFR
AG Ratio: 2 (calc) (ref 1.0–2.5)
ALT: 19 U/L (ref 9–46)
AST: 18 U/L (ref 10–35)
Albumin: 4.4 g/dL (ref 3.6–5.1)
Alkaline phosphatase (APISO): 65 U/L (ref 35–144)
BUN/Creatinine Ratio: 24 (calc) — ABNORMAL HIGH (ref 6–22)
BUN: 26 mg/dL — ABNORMAL HIGH (ref 7–25)
CO2: 29 mmol/L (ref 20–32)
Calcium: 10 mg/dL (ref 8.6–10.3)
Chloride: 107 mmol/L (ref 98–110)
Creat: 1.09 mg/dL (ref 0.70–1.35)
Globulin: 2.2 g/dL (calc) (ref 1.9–3.7)
Glucose, Bld: 96 mg/dL (ref 65–99)
Potassium: 4.3 mmol/L (ref 3.5–5.3)
Sodium: 141 mmol/L (ref 135–146)
Total Bilirubin: 0.5 mg/dL (ref 0.2–1.2)
Total Protein: 6.6 g/dL (ref 6.1–8.1)
eGFR: 77 mL/min/{1.73_m2} (ref >=60)

## 2022-01-20 NOTE — Patient Instructions (Addendum)
Call and make appt for follow up colonoscopy    Continue to cut back on smoking

## 2022-01-20 NOTE — Progress Notes (Signed)
  Careteam: Patient Care Team: ,  K, NP as PCP - General (Geriatric Medicine) Hung, Patrick, MD as Consulting Physician (Gastroenterology) Dahlstedt, Stephen, MD as Consulting Physician (Urology)  PLACE OF SERVICE:  [facility]  Advanced Directive information    Allergies  Allergen Reactions   Zoloft [Sertraline Hcl] Other (See Comments)    Lost points in time while taking the medication so weaned himself off of it   Codeine Itching and Swelling    Chief Complaint  Patient presents with   Medical Management of Chronic Issues    Patient presents today for a 6 month follow-up   Quality Metric Gaps    Colonoscopy, zoster, TDAP, COVID#3     HPI: Patient is a 61 y.o. male for routine follow up.   HTN: Blood pressure elevated today but didn't take medication this morning. States he does check it at home about three times a week and that it is usually 130's/80's.  Health Maintenance: Colonoscopy: plans to get it in November when his insurance takes effect. States he will get Zoster, COVID booster, and TDAP at [facility]. Received flu shot in office today  Toenail fungus concerns: Has used vicks vapor rub for 2 weeks in the past but did not help Cut nails down to help   COPD:Has not had to use rescue inhaler in a while. Denies dyspnea. Has cut back on smoking. Smokes less than half a pack a week.  Review of Systems:  Review of Systems  Constitutional:  Negative for fever, malaise/fatigue and weight loss.  HENT:  Negative for congestion and hearing loss.   Eyes:  Negative for blurred vision.  Respiratory:  Negative for cough, shortness of breath and wheezing.   Cardiovascular:  Negative for chest pain, palpitations and leg swelling.  Gastrointestinal:  Negative for abdominal pain, blood in stool, constipation, diarrhea, heartburn and melena.  Genitourinary:  Negative for dysuria and hematuria.  Musculoskeletal:  Negative for falls and joint pain.  Skin:   Negative for itching and rash.  Neurological:  Negative for dizziness and headaches.  Psychiatric/Behavioral:  Negative for depression. The patient is not nervous/anxious.     Past Medical History:  Diagnosis Date   Chest pain, unspecified    Chronic airway obstruction, not elsewhere classified    Chronic kidney disease    renal and ureteral stones   Depressive disorder, not elsewhere classified    Dermatophytosis of the body    Dyspepsia and other specified disorders of function of stomach    Headache    frequently, daily    History of kidney stones    Obesity, unspecified    Other abnormal blood chemistry    Other and unspecified hyperlipidemia    Other malaise and fatigue    Overweight(278.02)    Pain in limb    Psychosexual dysfunction with inhibited sexual excitement    Tobacco use disorder    Unspecified disorder of skin and subcutaneous tissue    Unspecified essential hypertension    Wheezing    Past Surgical History:  Procedure Laterality Date   ANAL FISSURE REPAIR  1985   CYSTOSCOPY WITH STENT PLACEMENT Right 04/24/2016   Procedure: CYSTOSCOPY WITH STENT PLACEMENT;  Surgeon: Stephen Dahlstedt, MD;  Location: [facility];  Service: Urology;  Laterality: Right;   CYSTOSCOPY/RETROGRADE/URETEROSCOPY/STONE EXTRACTION WITH BASKET Right 04/24/2016   Procedure: CYSTOSCOPY/RETROGRADE/URETEROSCOPY/STONE EXTRACTION WITH BASKETAND STENT PLACEMENT;  Surgeon: Stephen Dahlstedt, MD;  Location: [facility];  Service: Urology;  Laterality: Right;   HOLMIUM LASER APPLICATION   Right 04/24/2016   Procedure: HOLMIUM LASER APPLICATION;  Surgeon: Stephen Dahlstedt, MD;  Location: [facility];  Service: Urology;  Laterality: Right;   right renal stone removed  1983   Dr Kimbrough   Social History:   reports that he has been smoking cigarettes. He has a 20.00 pack-year smoking history. He has never used smokeless tobacco. He reports current alcohol use. He reports that he does not use drugs.  Family History   Problem Relation Age of Onset   Diabetes Father    Stroke Father    Stroke Sister    Diabetes Sister     Medications: Patient's Medications  New Prescriptions   No medications on file  Previous Medications   ALBUTEROL (VENTOLIN HFA) 108 (90 BASE) MCG/ACT INHALER    Inhale 2 puffs into the lungs every 6 (six) hours as needed for wheezing or shortness of breath.   AMLODIPINE (NORVASC) 10 MG TABLET    Take 1 tablet by mouth once daily   ASPIRIN 81 MG TABLET    Take 81 mg by mouth daily.    GARLIC PO    Take by mouth.   METOPROLOL SUCCINATE 25 MG CS24    Take 25 mg by mouth daily at 6 (six) AM.   MULTIVITAMIN (ONE-A-DAY MEN'S) TABS    Take 1 tablet by mouth daily.    OMEGA-3 FATTY ACIDS (FISH OIL) 1200 MG CAPS    Take 1,200 mg by mouth daily.    ROSUVASTATIN (CRESTOR) 40 MG TABLET    Take 1 tablet by mouth once daily   TAMSULOSIN (FLOMAX) 0.4 MG CAPS CAPSULE    Take 1 capsule (0.4 mg total) by mouth daily.  Modified Medications   No medications on file  Discontinued Medications   No medications on file    Physical Exam:  Vitals:   01/20/22 0905  BP: (!) 146/100  Pulse: 80  Temp: (!) 95.9 F (35.5 C)  SpO2: 98%  Weight: 100.1 kg  Height: 5' 11" (1.803 m)   Body mass index is 30.77 kg/m. Wt Readings from Last 3 Encounters:  01/20/22 100.1 kg  07/15/21 99.3 kg  12/03/20 99 kg    Physical Exam Constitutional:      General: He is not in acute distress. HENT:     Right Ear: Tympanic membrane normal. There is no impacted cerumen.     Left Ear: Tympanic membrane normal. There is no impacted cerumen.     Mouth/Throat:     Mouth: Mucous membranes are moist.     Pharynx: Oropharynx is clear.  Eyes:     Pupils: Pupils are equal, round, and reactive to light.  Cardiovascular:     Rate and Rhythm: Normal rate and regular rhythm.     Pulses: Normal pulses.     Heart sounds: Normal heart sounds. No murmur heard. Pulmonary:     Effort: Pulmonary effort is normal. No  respiratory distress.     Breath sounds: Normal breath sounds. No wheezing.  Abdominal:     General: Abdomen is flat. Bowel sounds are normal. There is no distension.     Palpations: Abdomen is soft.     Tenderness: There is no abdominal tenderness.  Musculoskeletal:     Right lower leg: No edema.     Left lower leg: No edema.  Skin:    General: Skin is warm and dry.  Neurological:     Mental Status: He is alert and oriented to person, place, and time. Mental status   is at baseline.  Psychiatric:        Mood and Affect: Mood normal.        Behavior: Behavior normal.     Labs reviewed: Basic Metabolic Panel: Recent Labs    07/15/21 0934  NA 142  K 4.1  CL 109  CO2 27  GLUCOSE 99  BUN 24  CREATININE 1.00  CALCIUM 9.8   Liver Function Tests: Recent Labs    07/15/21 0934  AST 17  ALT 19  BILITOT 0.4  PROT 6.2   No results for input(s): "LIPASE", "AMYLASE" in the last 8760 hours. No results for input(s): "AMMONIA" in the last 8760 hours. CBC: Recent Labs    07/15/21 0934  WBC 7.9  NEUTROABS 4,874  HGB 15.8  HCT 46.9  MCV 90.9  PLT 196   Lipid Panel: Recent Labs    07/15/21 0934  CHOL 149  HDL 56  LDLCALC 79  TRIG 56  CHOLHDL 2.7   TSH: No results for input(s): "TSH" in the last 8760 hours. A1C: Lab Results  Component Value Date   HGBA1C 5.1 12/12/2019     Assessment/Plan  1. Essential hypertension, benign --Blood pressure elevated today but typically well controlled -home blood pressures are well controlled -No changes to medications today  -will have pt continue to monitor home bp goal <140/90, to notify if readings remain high on 3 different days  -follow metabolic panel - continue Norvasc and metoprolol  - CBC with Differential/Platelet - CMP with eGFR([facility])  2. Need for influenza vaccination - received in office today  - Flu Vaccine QUAD 6mo+IM (Fluarix, Fluzone & Alfiuria Quad PF)  3. Pure hypercholesterolemia - continue  crestor - Lipid profile at next visit  4. Chronic obstructive pulmonary disease, unspecified COPD type ([facility]) - Stable - continue albuterol PRN  5. Tobacco use disorder, continuous - has made a lot of progress and decreased his amount of smoking  - encouraged continued and complete cessation  6. Benign prostatic hyperplasia with urinary hesitancy - continue flomax   7. Fungal infection of toenail  Would rather not take anymore oral medication. Discussed OTC options.   Return in about 6 months (around 07/22/2022) for routine follow up . I personally was present during the history, physical exam and medical decision-making activities of this service and have verified that the service and findings are accurately documented in the student's note  Student- Diamond Nowell, RN   K. , AGNP  [facility] [phone]   

## 2022-04-08 ENCOUNTER — Other Ambulatory Visit: Payer: Self-pay | Admitting: Nurse Practitioner

## 2022-04-21 ENCOUNTER — Other Ambulatory Visit: Payer: Self-pay | Admitting: Nurse Practitioner

## 2022-04-27 ENCOUNTER — Other Ambulatory Visit: Payer: Self-pay | Admitting: Nurse Practitioner

## 2022-06-09 DIAGNOSIS — R072 Precordial pain: Secondary | ICD-10-CM | POA: Diagnosis not present

## 2022-06-09 DIAGNOSIS — I425 Other restrictive cardiomyopathy: Secondary | ICD-10-CM | POA: Diagnosis not present

## 2022-06-09 DIAGNOSIS — I10 Essential (primary) hypertension: Secondary | ICD-10-CM | POA: Diagnosis not present

## 2022-06-09 DIAGNOSIS — E6609 Other obesity due to excess calories: Secondary | ICD-10-CM | POA: Diagnosis not present

## 2022-07-23 ENCOUNTER — Other Ambulatory Visit: Payer: Self-pay | Admitting: Nurse Practitioner

## 2022-07-28 ENCOUNTER — Ambulatory Visit: Payer: BC Managed Care – PPO | Admitting: Nurse Practitioner

## 2022-08-01 ENCOUNTER — Ambulatory Visit: Payer: BC Managed Care – PPO | Admitting: Nurse Practitioner

## 2022-08-18 ENCOUNTER — Ambulatory Visit: Payer: BC Managed Care – PPO | Admitting: Nurse Practitioner

## 2022-08-18 ENCOUNTER — Encounter: Payer: Self-pay | Admitting: Nurse Practitioner

## 2022-08-18 VITALS — BP 138/88 | HR 85 | Temp 97.6°F | Ht 71.0 in | Wt 226.0 lb

## 2022-08-18 DIAGNOSIS — R3911 Hesitancy of micturition: Secondary | ICD-10-CM

## 2022-08-18 DIAGNOSIS — Z6830 Body mass index (BMI) 30.0-30.9, adult: Secondary | ICD-10-CM

## 2022-08-18 DIAGNOSIS — N401 Enlarged prostate with lower urinary tract symptoms: Secondary | ICD-10-CM | POA: Diagnosis not present

## 2022-08-18 DIAGNOSIS — E6609 Other obesity due to excess calories: Secondary | ICD-10-CM

## 2022-08-18 DIAGNOSIS — Z0000 Encounter for general adult medical examination without abnormal findings: Secondary | ICD-10-CM | POA: Diagnosis not present

## 2022-08-18 DIAGNOSIS — J449 Chronic obstructive pulmonary disease, unspecified: Secondary | ICD-10-CM

## 2022-08-18 DIAGNOSIS — E7800 Pure hypercholesterolemia, unspecified: Secondary | ICD-10-CM | POA: Diagnosis not present

## 2022-08-18 DIAGNOSIS — Z1211 Encounter for screening for malignant neoplasm of colon: Secondary | ICD-10-CM

## 2022-08-18 DIAGNOSIS — F17209 Nicotine dependence, unspecified, with unspecified nicotine-induced disorders: Secondary | ICD-10-CM

## 2022-08-18 LAB — CBC WITH DIFFERENTIAL/PLATELET
Absolute Monocytes: 630 cells/uL (ref 200–950)
Eosinophils Absolute: 269 cells/uL (ref 15–500)
MCHC: 34 g/dL (ref 32.0–36.0)
Monocytes Relative: 7.5 %
Platelets: 179 10*3/uL (ref 140–400)
Total Lymphocyte: 26.4 %
WBC: 8.4 10*3/uL (ref 3.8–10.8)

## 2022-08-18 MED ORDER — ROSUVASTATIN CALCIUM 40 MG PO TABS: 40.0000 mg | ORAL_TABLET | Freq: Every day | ORAL | 1 refills | Status: DC

## 2022-08-18 MED ORDER — TAMSULOSIN HCL 0.4 MG PO CAPS: 0.4000 mg | ORAL_CAPSULE | Freq: Every day | ORAL | 1 refills | Status: DC

## 2022-08-18 NOTE — Patient Instructions (Signed)
Shingrix and TDAP vaccine from pharmacy Colonoscopy

## 2022-08-18 NOTE — Progress Notes (Signed)
Careteam: Patient Care Team: Sharon Seller, NP as PCP - General (Geriatric Medicine) Jeani Hawking, MD as Consulting Physician (Gastroenterology) Marcine Matar, MD as Consulting Physician (Urology)  PLACE OF SERVICE:  [facility]  Advanced Directive information    Allergies  Allergen Reactions   Zoloft [Sertraline Hcl] Other (See Comments)    Lost points in time while taking the medication so weaned himself off of it   Codeine Itching and Swelling    Chief Complaint  Patient presents with   Medical Management of Chronic Issues    Patient presents today for a 7 month follow-up   Quality Metric Gaps    Lung cancer screening,colonoscopy, zoster, TDAP, COVID#3     HPI: Patient is a 62 y.o. male for routine follow up  He has to appear for jury duty June 10th.   HTN- controlled on amlodipine and metoprolol  Bph- continues to have symptoms on flomax  Hyperlipidemia- continues on crestor   Smoker- varies from none to 1-2.  He quit smoking for a little bit and his wife still smokes and can not get her to quit.   Had a reschedule colonscopy appt and then they did not call him back.  Review of Systems:  Review of Systems  Constitutional:  Negative for chills, fever and weight loss.  HENT:  Negative for tinnitus.   Respiratory:  Negative for cough, sputum production and shortness of breath.   Cardiovascular:  Negative for chest pain, palpitations and leg swelling.  Gastrointestinal:  Negative for abdominal pain, constipation, diarrhea and heartburn.  Genitourinary:  Negative for dysuria, frequency and urgency.  Musculoskeletal:  Negative for back pain, falls, joint pain and myalgias.  Skin: Negative.   Neurological:  Negative for dizziness and headaches.  Psychiatric/Behavioral:  Negative for depression and memory loss. The patient does not have insomnia.     Past Medical History:  Diagnosis Date   Chest pain, unspecified    Chronic airway obstruction, not  elsewhere classified    Chronic kidney disease    renal and ureteral stones   Depressive disorder, not elsewhere classified    Dermatophytosis of the body    Dyspepsia and other specified disorders of function of stomach    Headache    frequently, daily    History of kidney stones    Obesity, unspecified    Other abnormal blood chemistry    Other and unspecified hyperlipidemia    Other malaise and fatigue    Overweight(278.02)    Pain in limb    Psychosexual dysfunction with inhibited sexual excitement    Tobacco use disorder    Unspecified disorder of skin and subcutaneous tissue    Unspecified essential hypertension    Wheezing    Past Surgical History:  Procedure Laterality Date   ANAL FISSURE REPAIR  1985   CYSTOSCOPY WITH STENT PLACEMENT Right 04/24/2016   Procedure: CYSTOSCOPY WITH STENT PLACEMENT;  Surgeon: Marcine Matar, MD;  Location: [facility];  Service: Urology;  Laterality: Right;   CYSTOSCOPY/RETROGRADE/URETEROSCOPY/STONE EXTRACTION WITH BASKET Right 04/24/2016   Procedure: CYSTOSCOPY/RETROGRADE/URETEROSCOPY/STONE EXTRACTION WITH BASKETAND STENT PLACEMENT;  Surgeon: Marcine Matar, MD;  Location: [facility];  Service: Urology;  Laterality: Right;   HOLMIUM LASER APPLICATION Right 04/24/2016   Procedure: HOLMIUM LASER APPLICATION;  Surgeon: Marcine Matar, MD;  Location: [facility];  Service: Urology;  Laterality: Right;   right renal stone removed  1983   Dr Aldean Ast   Social History:   reports that he has been smoking cigarettes.  He has a 20.00 pack-year smoking history. He has never used smokeless tobacco. He reports current alcohol use. He reports that he does not use drugs.  Family History  Problem Relation Age of Onset   Diabetes Father    Stroke Father    Stroke Sister    Diabetes Sister     Medications: Patient's Medications  New Prescriptions   No medications on file  Previous Medications   ALBUTEROL (VENTOLIN HFA) 108 (90 BASE) MCG/ACT INHALER     Inhale 2 puffs into the lungs every 6 (six) hours as needed for wheezing or shortness of breath.   AMLODIPINE (NORVASC) 10 MG TABLET    Take 1 tablet by mouth once daily   ASPIRIN 81 MG TABLET    Take 81 mg by mouth daily.    GARLIC PO    Take by mouth.   MECLIZINE (ANTIVERT) 25 MG TABLET    Take 25 mg by mouth daily.   METOPROLOL SUCCINATE 25 MG CS24    Take 25 mg by mouth daily at 6 (six) AM.   MULTIVITAMIN (ONE-A-DAY MEN'S) TABS    Take 1 tablet by mouth daily.    OMEGA-3 FATTY ACIDS (FISH OIL) 1200 MG CAPS    Take 1,200 mg by mouth daily.   Modified Medications   Modified Medication Previous Medication   ROSUVASTATIN (CRESTOR) 40 MG TABLET rosuvastatin (CRESTOR) 40 MG tablet      Take 1 tablet (40 mg total) by mouth daily.    Take 1 tablet by mouth once daily   TAMSULOSIN (FLOMAX) 0.4 MG CAPS CAPSULE tamsulosin (FLOMAX) 0.4 MG CAPS capsule      Take 1 capsule (0.4 mg total) by mouth daily.    Take 1 capsule by mouth once daily  Discontinued Medications   No medications on file    Physical Exam:  Vitals:   08/18/22 0930  BP: 138/88  Pulse: 85  Temp: 97.6 F (36.4 C)  SpO2: 95%  Weight: 226 lb (102.5 kg)  Height: 5\' 11"  (1.803 m)   Body mass index is 31.52 kg/m. Wt Readings from Last 3 Encounters:  08/18/22 226 lb (102.5 kg)  01/20/22 220 lb 9.6 oz (100.1 kg)  07/15/21 219 lb (99.3 kg)    Physical Exam Constitutional:      General: He is not in acute distress.    Appearance: He is well-developed. He is not diaphoretic.  HENT:     Head: Normocephalic and atraumatic.     Right Ear: External ear normal.     Left Ear: External ear normal.     Mouth/Throat:     Pharynx: No oropharyngeal exudate.  Eyes:     Conjunctiva/sclera: Conjunctivae normal.     Pupils: Pupils are equal, round, and reactive to light.  Cardiovascular:     Rate and Rhythm: Normal rate and regular rhythm.     Heart sounds: Normal heart sounds.  Pulmonary:     Effort: Pulmonary effort is normal.      Breath sounds: Normal breath sounds.  Abdominal:     General: Bowel sounds are normal.     Palpations: Abdomen is soft.  Musculoskeletal:        General: No tenderness.     Cervical back: Normal range of motion and neck supple.     Right lower leg: No edema.     Left lower leg: No edema.  Skin:    General: Skin is warm and dry.  Neurological:     Mental  Status: He is alert and oriented to person, place, and time.  Psychiatric:        Mood and Affect: Mood normal.        Behavior: Behavior normal.     Labs reviewed: Basic Metabolic Panel: Recent Labs    01/20/22 1008  NA 141  K 4.3  CL 107  CO2 29  GLUCOSE 96  BUN 26*  CREATININE 1.09  CALCIUM 10.0   Liver Function Tests: Recent Labs    01/20/22 1008  AST 18  ALT 19  BILITOT 0.5  PROT 6.6   No results for input(s): "LIPASE", "AMYLASE" in the last 8760 hours. No results for input(s): "AMMONIA" in the last 8760 hours. CBC: Recent Labs    01/20/22 1008  WBC 6.7  NEUTROABS 3,973  HGB 17.0  HCT 48.3  MCV 89.3  PLT 183   Lipid Panel: No results for input(s): "CHOL", "HDL", "LDLCALC", "TRIG", "CHOLHDL", "LDLDIRECT" in the last 8760 hours. TSH: No results for input(s): "TSH" in the last 8760 hours. A1C: Lab Results  Component Value Date   HGBA1C 5.1 12/12/2019     Assessment/Plan 1. Preventative health care -wellness visit completed today, The patient was counseled regarding the appropriate use of alcohol, regular self-examination of the breasts on a monthly basis, prevention of dental and periodontal disease, diet, regular sustained exercise for at least 30 minutes 5 times per week, testicular self-examination on a monthly basis,smoking cessation, tobacco use,  and recommended schedule for GI hemoccult testing, colonoscopy, cholesterol, thyroid and diabetes screening.  2. Encounter for screening colonoscopy Overdue, recommended to call for  - Ambulatory referral to Gastroenterology  3. Pure  hypercholesterolemia -continue dietary modifications - rosuvastatin (CRESTOR) 40 MG tablet; Take 1 tablet (40 mg total) by mouth daily.  Dispense: 90 tablet; Refill: 1 - Lipid panel - COMPLETE METABOLIC PANEL WITH GFR  4. Benign prostatic hyperplasia with urinary hesitancy -ongoing,- tamsulosin (FLOMAX) 0.4 MG CAPS capsule; Take 1 capsule (0.4 mg total) by mouth daily.  Dispense: 90 capsule; Refill: 1 - PSA  5. Chronic obstructive pulmonary disease, unspecified COPD type (HCC) Stable, not currently on daily medication   6. Tobacco use disorder, continuous Cessation encouraged - CBC with Differential/Platelet - Ambulatory Referral for Lung Cancer Scre  7. Class 1 obesity due to excess calories without serious comorbidity with body mass index (BMI) of 30.0 to 30.9 in adult --education provided on healthy weight loss through increase in physical activity and proper nutrition    Return in about 6 months (around 02/18/2023) for routine follow up.  Janene Harvey. Biagio Borg [facility] [phone]

## 2022-08-19 LAB — COMPLETE METABOLIC PANEL WITH GFR
AG Ratio: 2.1 (calc) (ref 1.0–2.5)
ALT: 17 U/L (ref 9–46)
AST: 13 U/L (ref 10–35)
Albumin: 4.1 g/dL (ref 3.6–5.1)
Alkaline phosphatase (APISO): 68 U/L (ref 35–144)
BUN: 19 mg/dL (ref 7–25)
CO2: 25 mmol/L (ref 20–32)
Calcium: 9.4 mg/dL (ref 8.6–10.3)
Chloride: 109 mmol/L (ref 98–110)
Creat: 1.03 mg/dL (ref 0.70–1.35)
Globulin: 2 g/dL (calc) (ref 1.9–3.7)
Glucose, Bld: 95 mg/dL (ref 65–99)
Potassium: 4.4 mmol/L (ref 3.5–5.3)
Sodium: 142 mmol/L (ref 135–146)
Total Bilirubin: 0.3 mg/dL (ref 0.2–1.2)
Total Protein: 6.1 g/dL (ref 6.1–8.1)
eGFR: 83 mL/min/{1.73_m2} (ref >=60)

## 2022-08-19 LAB — LIPID PANEL
Cholesterol: 150 mg/dL (ref <200)
HDL: 62 mg/dL (ref >=40)
LDL Cholesterol (Calc): 71 mg/dL (calc)
Non-HDL Cholesterol (Calc): 88 mg/dL (calc) (ref <130)
Total CHOL/HDL Ratio: 2.4 (calc) (ref <5.0)
Triglycerides: 90 mg/dL (ref <150)

## 2022-08-19 LAB — CBC WITH DIFFERENTIAL/PLATELET
Basophils Absolute: 42 cells/uL (ref 0–200)
Basophils Relative: 0.5 %
Eosinophils Relative: 3.2 %
HCT: 46.8 % (ref 38.5–50.0)
Hemoglobin: 15.9 g/dL (ref 13.2–17.1)
Lymphs Abs: 2218 cells/uL (ref 850–3900)
MCH: 30.7 pg (ref 27.0–33.0)
MCV: 90.3 fL (ref 80.0–100.0)
MPV: 10.1 fL (ref 7.5–12.5)
Neutro Abs: 5242 cells/uL (ref 1500–7800)
Neutrophils Relative %: 62.4 %
RBC: 5.18 10*6/uL (ref 4.20–5.80)
RDW: 13.7 % (ref 11.0–15.0)

## 2022-08-19 LAB — PSA: PSA: 1.78 ng/mL (ref <=4.00)

## 2022-08-25 DIAGNOSIS — K625 Hemorrhage of anus and rectum: Secondary | ICD-10-CM | POA: Diagnosis not present

## 2022-08-25 DIAGNOSIS — Z1211 Encounter for screening for malignant neoplasm of colon: Secondary | ICD-10-CM | POA: Diagnosis not present

## 2022-08-25 DIAGNOSIS — I10 Essential (primary) hypertension: Secondary | ICD-10-CM | POA: Diagnosis not present

## 2022-10-02 DIAGNOSIS — K635 Polyp of colon: Secondary | ICD-10-CM | POA: Diagnosis not present

## 2022-10-02 DIAGNOSIS — D123 Benign neoplasm of transverse colon: Secondary | ICD-10-CM | POA: Diagnosis not present

## 2022-10-02 DIAGNOSIS — Z1211 Encounter for screening for malignant neoplasm of colon: Secondary | ICD-10-CM | POA: Diagnosis not present

## 2022-10-02 DIAGNOSIS — K5730 Diverticulosis of large intestine without perforation or abscess without bleeding: Secondary | ICD-10-CM | POA: Diagnosis not present

## 2022-10-02 DIAGNOSIS — K5140 Inflammatory polyps of colon without complications: Secondary | ICD-10-CM | POA: Diagnosis not present

## 2022-10-02 LAB — HM COLONOSCOPY

## 2022-10-03 ENCOUNTER — Other Ambulatory Visit: Payer: Self-pay | Admitting: Nurse Practitioner

## 2022-10-03 DIAGNOSIS — N401 Enlarged prostate with lower urinary tract symptoms: Secondary | ICD-10-CM

## 2022-10-06 DIAGNOSIS — R072 Precordial pain: Secondary | ICD-10-CM | POA: Diagnosis not present

## 2022-10-06 DIAGNOSIS — E6609 Other obesity due to excess calories: Secondary | ICD-10-CM | POA: Diagnosis not present

## 2022-10-06 DIAGNOSIS — I425 Other restrictive cardiomyopathy: Secondary | ICD-10-CM | POA: Diagnosis not present

## 2022-10-06 DIAGNOSIS — I10 Essential (primary) hypertension: Secondary | ICD-10-CM | POA: Diagnosis not present

## 2023-01-26 ENCOUNTER — Other Ambulatory Visit: Payer: Self-pay | Admitting: Nurse Practitioner

## 2023-01-26 DIAGNOSIS — E7800 Pure hypercholesterolemia, unspecified: Secondary | ICD-10-CM

## 2023-02-02 DIAGNOSIS — E6609 Other obesity due to excess calories: Secondary | ICD-10-CM | POA: Diagnosis not present

## 2023-02-02 DIAGNOSIS — I10 Essential (primary) hypertension: Secondary | ICD-10-CM | POA: Diagnosis not present

## 2023-02-02 DIAGNOSIS — R072 Precordial pain: Secondary | ICD-10-CM | POA: Diagnosis not present

## 2023-02-02 DIAGNOSIS — I425 Other restrictive cardiomyopathy: Secondary | ICD-10-CM | POA: Diagnosis not present

## 2023-02-23 ENCOUNTER — Encounter: Payer: Self-pay | Admitting: Nurse Practitioner

## 2023-02-23 ENCOUNTER — Ambulatory Visit: Payer: BC Managed Care – PPO | Admitting: Nurse Practitioner

## 2023-02-23 VITALS — BP 128/80 | HR 92 | Temp 97.1°F | Ht 71.0 in | Wt 225.0 lb

## 2023-02-23 DIAGNOSIS — E7800 Pure hypercholesterolemia, unspecified: Secondary | ICD-10-CM

## 2023-02-23 DIAGNOSIS — F17209 Nicotine dependence, unspecified, with unspecified nicotine-induced disorders: Secondary | ICD-10-CM | POA: Diagnosis not present

## 2023-02-23 DIAGNOSIS — N401 Enlarged prostate with lower urinary tract symptoms: Secondary | ICD-10-CM | POA: Diagnosis not present

## 2023-02-23 DIAGNOSIS — J449 Chronic obstructive pulmonary disease, unspecified: Secondary | ICD-10-CM | POA: Diagnosis not present

## 2023-02-23 DIAGNOSIS — Z23 Encounter for immunization: Secondary | ICD-10-CM

## 2023-02-23 DIAGNOSIS — Z6830 Body mass index (BMI) 30.0-30.9, adult: Secondary | ICD-10-CM

## 2023-02-23 DIAGNOSIS — E6609 Other obesity due to excess calories: Secondary | ICD-10-CM

## 2023-02-23 DIAGNOSIS — I10 Essential (primary) hypertension: Secondary | ICD-10-CM | POA: Diagnosis not present

## 2023-02-23 DIAGNOSIS — E66811 Obesity, class 1: Secondary | ICD-10-CM

## 2023-02-23 DIAGNOSIS — R3911 Hesitancy of micturition: Secondary | ICD-10-CM

## 2023-02-23 MED ORDER — AMLODIPINE BESYLATE 10 MG PO TABS: 10.0000 mg | ORAL_TABLET | Freq: Every day | ORAL | 1 refills | Status: AC

## 2023-02-23 MED ORDER — ALBUTEROL SULFATE HFA 108 (90 BASE) MCG/ACT IN AERS: 2.0000 | INHALATION_SPRAY | Freq: Four times a day (QID) | RESPIRATORY_TRACT | 5 refills | Status: DC | PRN

## 2023-02-23 MED ORDER — MECLIZINE HCL 25 MG PO TABS: 25.0000 mg | ORAL_TABLET | Freq: Every day | ORAL | 1 refills | Status: DC

## 2023-02-23 NOTE — Progress Notes (Signed)
Careteam: Patient Care Team: Sharon Seller, NP as PCP - General (Geriatric Medicine) Jeani Hawking, MD as Consulting Physician (Gastroenterology) Marcine Matar, MD as Consulting Physician (Urology)  PLACE OF SERVICE:  [facility]  Advanced Directive information Does Patient Have a Medical Advance Directive?: No, Would patient like information on creating a medical advance directive?: No - Patient declined  Allergies  Allergen Reactions   Zoloft [Sertraline Hcl] Other (See Comments)    Lost points in time while taking the medication so weaned himself off of it   Codeine Itching and Swelling    Chief Complaint  Patient presents with   Medical Management of Chronic Issues    6 month follow-up. Discuss need for td/tdap, shingrix, covid booster and flu vaccine      HPI: Patient is a 62 y.o. male for routine follow up.  Never heard about the lung cancer screening-continues to smoke.   Reports he continues to be dizzy esp when he changes positions from sitting to standing.  He is a [employer] and does not drink a lot of water either.   Sees Ricki Rodriguez, MD.-does stress test occasionally and EKG yearly  No chest pain.  Started seeing him because DOT thought he heard a murmur but he does not have.   Continues to smoke- has cut back.   BPH- controlled on flo max   Active at work. Reports working on diet  Review of Systems:  Review of Systems  Constitutional:  Negative for chills, fever and weight loss.  HENT:  Negative for tinnitus.   Respiratory:  Negative for cough, sputum production and shortness of breath.   Cardiovascular:  Negative for chest pain, palpitations and leg swelling.  Gastrointestinal:  Negative for abdominal pain, constipation, diarrhea and heartburn.  Genitourinary:  Negative for dysuria, frequency and urgency.  Musculoskeletal:  Negative for back pain, falls, joint pain and myalgias.  Skin: Negative.   Neurological:  Positive for  dizziness (when changing position). Negative for headaches.  Psychiatric/Behavioral:  Negative for depression and memory loss. The patient does not have insomnia.     Past Medical History:  Diagnosis Date   Chest pain, unspecified    Chronic airway obstruction, not elsewhere classified    Chronic kidney disease    renal and ureteral stones   Depressive disorder, not elsewhere classified    Dermatophytosis of the body    Dyspepsia and other specified disorders of function of stomach    Headache    frequently, daily    History of kidney stones    Obesity, unspecified    Other abnormal blood chemistry    Other and unspecified hyperlipidemia    Other malaise and fatigue    Overweight(278.02)    Pain in limb    Psychosexual dysfunction with inhibited sexual excitement    Tobacco use disorder    Unspecified disorder of skin and subcutaneous tissue    Unspecified essential hypertension    Wheezing    Past Surgical History:  Procedure Laterality Date   ANAL FISSURE REPAIR  1985   CYSTOSCOPY WITH STENT PLACEMENT Right 04/24/2016   Procedure: CYSTOSCOPY WITH STENT PLACEMENT;  Surgeon: Marcine Matar, MD;  Location: [facility];  Service: Urology;  Laterality: Right;   CYSTOSCOPY/RETROGRADE/URETEROSCOPY/STONE EXTRACTION WITH BASKET Right 04/24/2016   Procedure: CYSTOSCOPY/RETROGRADE/URETEROSCOPY/STONE EXTRACTION WITH BASKETAND STENT PLACEMENT;  Surgeon: Marcine Matar, MD;  Location: [facility];  Service: Urology;  Laterality: Right;   HOLMIUM LASER APPLICATION Right 04/24/2016   Procedure: HOLMIUM LASER  APPLICATION;  Surgeon: Marcine Matar, MD;  Location: [facility];  Service: Urology;  Laterality: Right;   right renal stone removed  1983   Dr Aldean Ast   Social History:   reports that he has been smoking cigarettes. He has a 20 pack-year smoking history. He has never used smokeless tobacco. He reports current alcohol use. He reports that he does not use drugs.  Family History  Problem  Relation Age of Onset   Diabetes Father    Stroke Father    Stroke Sister    Diabetes Sister     Medications: Patient's Medications  New Prescriptions   No medications on file  Previous Medications   ALBUTEROL (VENTOLIN HFA) 108 (90 BASE) MCG/ACT INHALER    Inhale 2 puffs into the lungs every 6 (six) hours as needed for wheezing or shortness of breath.   AMLODIPINE (NORVASC) 10 MG TABLET    Take 1 tablet by mouth once daily   ASPIRIN 81 MG TABLET    Take 81 mg by mouth daily.    GARLIC PO    Take by mouth.   MECLIZINE (ANTIVERT) 25 MG TABLET    Take 25 mg by mouth daily.   METOPROLOL SUCCINATE 25 MG CS24    Take 25 mg by mouth daily at 6 (six) AM.   MULTIVITAMIN (ONE-A-DAY MEN'S) TABS    Take 1 tablet by mouth daily.    OMEGA-3 FATTY ACIDS (FISH OIL) 1200 MG CAPS    Take 1,200 mg by mouth daily.    ROSUVASTATIN (CRESTOR) 40 MG TABLET    Take 1 tablet by mouth once daily   TAMSULOSIN (FLOMAX) 0.4 MG CAPS CAPSULE    TAKE 1 CAPSULE BY MOUTH ONCE DAILY  Modified Medications   No medications on file  Discontinued Medications   No medications on file    Physical Exam:  Vitals:   02/23/23 0835  BP: 128/80  Pulse: 92  Temp: (!) 97.1 F (36.2 C)  TempSrc: Temporal  SpO2: 96%  Weight: 225 lb (102.1 kg)  Height: 5\' 11"  (1.803 m)   Body mass index is 31.38 kg/m. Wt Readings from Last 3 Encounters:  02/23/23 225 lb (102.1 kg)  08/18/22 226 lb (102.5 kg)  01/20/22 220 lb 9.6 oz (100.1 kg)    Physical Exam Constitutional:      General: He is not in acute distress.    Appearance: He is well-developed. He is not diaphoretic.  HENT:     Head: Normocephalic and atraumatic.     Right Ear: External ear normal.     Left Ear: External ear normal.     Mouth/Throat:     Pharynx: No oropharyngeal exudate.  Eyes:     Conjunctiva/sclera: Conjunctivae normal.     Pupils: Pupils are equal, round, and reactive to light.  Cardiovascular:     Rate and Rhythm: Normal rate and regular  rhythm.     Heart sounds: Normal heart sounds.  Pulmonary:     Effort: Pulmonary effort is normal.     Breath sounds: Normal breath sounds.  Abdominal:     General: Bowel sounds are normal.     Palpations: Abdomen is soft.  Musculoskeletal:        General: No tenderness.     Cervical back: Normal range of motion and neck supple.     Right lower leg: No edema.     Left lower leg: No edema.  Skin:    General: Skin is warm and dry.  Neurological:     Mental Status: He is alert and oriented to person, place, and time.     Labs reviewed: Basic Metabolic Panel: Recent Labs    08/18/22 1026  NA 142  K 4.4  CL 109  CO2 25  GLUCOSE 95  BUN 19  CREATININE 1.03  CALCIUM 9.4   Liver Function Tests: Recent Labs    08/18/22 1026  AST 13  ALT 17  BILITOT 0.3  PROT 6.1   No results for input(s): "LIPASE", "AMYLASE" in the last 8760 hours. No results for input(s): "AMMONIA" in the last 8760 hours. CBC: Recent Labs    08/18/22 1026  WBC 8.4  NEUTROABS 5,242  HGB 15.9  HCT 46.8  MCV 90.3  PLT 179   Lipid Panel: Recent Labs    08/18/22 1026  CHOL 150  HDL 62  LDLCALC 71  TRIG 90  CHOLHDL 2.4   TSH: No results for input(s): "TSH" in the last 8760 hours. A1C: Lab Results  Component Value Date   HGBA1C 5.1 12/12/2019     Assessment/Plan 1. Chronic obstructive pulmonary disease, unspecified COPD type (HCC) Stable, no worsening of symptoms. Encouraged cessation of smoking - albuterol (VENTOLIN HFA) 108 (90 Base) MCG/ACT inhaler; Inhale 2 puffs into the lungs every 6 (six) hours as needed for wheezing or shortness of breath.  Dispense: 1 each; Refill: 5  2. Benign prostatic hyperplasia with urinary hesitancy Controlled on flomax  3. Tobacco use disorder, continuous Encouraged cessation - Ambulatory Referral for Lung Cancer Scre  4. Need for influenza vaccination - Flu vaccine trivalent PF, 6mos and older(Flulaval,Afluria,Fluarix,Fluzone)  5. Essential  hypertension, benign -Blood pressure well controlled, goal bp <140/90 Continue current medications and dietary modifications follow metabolic panel Orthostatics were negative Encouraged taking time from sitting to standing with proper hydration.  - amLODipine (NORVASC) 10 MG tablet; Take 1 tablet (10 mg total) by mouth daily.  Dispense: 90 tablet; Refill: 1 - COMPLETE METABOLIC PANEL WITH GFR - CBC with Differential/Platelet  6. Pure hypercholesterolemia -continues on crestor and dietary modifications   7. Class 1 obesity due to excess calories without serious comorbidity with body mass index (BMI) of 30.0 to 30.9 in adult --education provided on healthy weight loss through increase in physical activity and proper nutrition    Return in about 6 months (around 08/23/2023) for CPE.  Janene Harvey. Biagio Borg [facility] [phone]

## 2023-02-23 NOTE — Patient Instructions (Signed)
Sign record release for Tom Rodriguez, MD

## 2023-02-24 LAB — COMPLETE METABOLIC PANEL WITH GFR
AG Ratio: 2 (calc) (ref 1.0–2.5)
ALT: 25 U/L (ref 9–46)
AST: 18 U/L (ref 10–35)
Albumin: 4.2 g/dL (ref 3.6–5.1)
Alkaline phosphatase (APISO): 69 U/L (ref 35–144)
BUN/Creatinine Ratio: 20 (calc) (ref 6–22)
BUN: 27 mg/dL — ABNORMAL HIGH (ref 7–25)
CO2: 28 mmol/L (ref 20–32)
Calcium: 10.1 mg/dL (ref 8.6–10.3)
Chloride: 106 mmol/L (ref 98–110)
Creat: 1.34 mg/dL (ref 0.70–1.35)
Globulin: 2.1 g/dL (ref 1.9–3.7)
Glucose, Bld: 98 mg/dL (ref 65–99)
Potassium: 4.2 mmol/L (ref 3.5–5.3)
Sodium: 141 mmol/L (ref 135–146)
Total Bilirubin: 0.3 mg/dL (ref 0.2–1.2)
Total Protein: 6.3 g/dL (ref 6.1–8.1)
eGFR: 60 mL/min/{1.73_m2} (ref >=60)

## 2023-02-24 LAB — CBC WITH DIFFERENTIAL/PLATELET
Absolute Lymphocytes: 2309 {cells}/uL (ref 850–3900)
Absolute Monocytes: 733 {cells}/uL (ref 200–950)
Basophils Absolute: 37 {cells}/uL (ref 0–200)
Basophils Relative: 0.5 %
Eosinophils Absolute: 281 {cells}/uL (ref 15–500)
Eosinophils Relative: 3.8 %
HCT: 50.2 % — ABNORMAL HIGH (ref 38.5–50.0)
Hemoglobin: 16.7 g/dL (ref 13.2–17.1)
MCH: 30.4 pg (ref 27.0–33.0)
MCHC: 33.3 g/dL (ref 32.0–36.0)
MCV: 91.4 fL (ref 80.0–100.0)
MPV: 9.9 fL (ref 7.5–12.5)
Monocytes Relative: 9.9 %
Neutro Abs: 4040 {cells}/uL (ref 1500–7800)
Neutrophils Relative %: 54.6 %
Platelets: 196 10*3/uL (ref 140–400)
RBC: 5.49 10*6/uL (ref 4.20–5.80)
RDW: 13.7 % (ref 11.0–15.0)
Total Lymphocyte: 31.2 %
WBC: 7.4 10*3/uL (ref 3.8–10.8)

## 2023-05-11 DIAGNOSIS — I340 Nonrheumatic mitral (valve) insufficiency: Secondary | ICD-10-CM | POA: Diagnosis not present

## 2023-05-11 DIAGNOSIS — I425 Other restrictive cardiomyopathy: Secondary | ICD-10-CM | POA: Diagnosis not present

## 2023-05-11 DIAGNOSIS — R072 Precordial pain: Secondary | ICD-10-CM | POA: Diagnosis not present

## 2023-07-20 ENCOUNTER — Telehealth: Payer: Self-pay | Admitting: Acute Care

## 2023-07-20 DIAGNOSIS — Z87891 Personal history of nicotine dependence: Secondary | ICD-10-CM

## 2023-07-20 DIAGNOSIS — Z122 Encounter for screening for malignant neoplasm of respiratory organs: Secondary | ICD-10-CM

## 2023-07-20 DIAGNOSIS — F17210 Nicotine dependence, cigarettes, uncomplicated: Secondary | ICD-10-CM

## 2023-07-20 NOTE — Telephone Encounter (Signed)
 Lung Cancer Screening Narrative/Criteria Questionnaire (Cigarette Smokers Only- No Cigars/Pipes/vapes)   Tom Lane   SDMV:08/31/2023 at 9:30a Tom Lane       December 11, 1960   LDCT: 09/14/2023 at 8:00a at [facility]     63 y.o.   Phone: [phone]  Lung Screening Narrative (confirm age 72-77 yrs Medicare / 50-80 yrs Private pay insurance)   Insurance information:BCBS   Referring Provider:Jessica Janyth Contes   This screening involves an initial phone call with a team member from our program. It is called a shared decision making visit. The initial meeting is required by  insurance and Medicare to make sure you understand the program. This appointment takes about 15-20 minutes to complete. You will complete the screening scan at your scheduled date/time.  This scan takes about 5-10 minutes to complete. You can eat and drink normally before and after the scan.  Criteria questions for Lung Cancer Screening:   Are you a current or former smoker? Current Age began smoking: 63yo   If you are a former smoker, what year did you quit smoking? N/A(within 15 yrs)   To calculate your smoking history, I need an accurate estimate of how many packs of cigarettes you smoked per day and for how many years. (Not just the number of PPD you are now smoking)   Years smoking 50 x Packs per day 1.75 = Pack years 80   (at least 20 pack yrs)   (Make sure they understand that we need to know how much they have smoked in the past, not just the number of PPD they are smoking now)  Do you have a personal history of cancer?  No    Do you have a family history of cancer? No  Are you coughing up blood?  No  Have you had unexplained weight loss of 15 lbs or more in the last 6 months? No  It looks like you meet all criteria.  When would be a good time for [place] to schedule you for this screening?   Additional information: N/A

## 2023-08-07 ENCOUNTER — Telehealth: Payer: Self-pay

## 2023-08-07 NOTE — Telephone Encounter (Signed)
 Copied from CRM [id number]. Topic: Appointments - Appointment Cancel/Reschedule >> Aug 07, 2023  2:59 PM Tom Lane wrote: Patient called and wanted to cancel his imaging at [facility] for a lung screening, patient will be out of town and wants to move it a day up, patients callback number is [phone].  Spoke with patient this afternoon to let him know that he will need to call his pulmonary doctor to reschedule this appointment. Patient has agree with no questions or concerns.

## 2023-08-24 ENCOUNTER — Ambulatory Visit: Payer: BC Managed Care – PPO | Admitting: Nurse Practitioner

## 2023-08-24 ENCOUNTER — Encounter: Payer: Self-pay | Admitting: Nurse Practitioner

## 2023-08-24 VITALS — BP 118/76 | HR 87 | Ht 71.0 in | Wt 222.6 lb

## 2023-08-24 DIAGNOSIS — F17209 Nicotine dependence, unspecified, with unspecified nicotine-induced disorders: Secondary | ICD-10-CM | POA: Diagnosis not present

## 2023-08-24 DIAGNOSIS — I10 Essential (primary) hypertension: Secondary | ICD-10-CM | POA: Diagnosis not present

## 2023-08-24 DIAGNOSIS — E7800 Pure hypercholesterolemia, unspecified: Secondary | ICD-10-CM | POA: Diagnosis not present

## 2023-08-24 DIAGNOSIS — R3911 Hesitancy of micturition: Secondary | ICD-10-CM

## 2023-08-24 DIAGNOSIS — Z0000 Encounter for general adult medical examination without abnormal findings: Secondary | ICD-10-CM | POA: Diagnosis not present

## 2023-08-24 DIAGNOSIS — N401 Enlarged prostate with lower urinary tract symptoms: Secondary | ICD-10-CM

## 2023-08-24 DIAGNOSIS — J449 Chronic obstructive pulmonary disease, unspecified: Secondary | ICD-10-CM

## 2023-08-24 NOTE — Progress Notes (Signed)
 Careteam: Patient Care Team: Verma Gobble, NP as PCP - General (Geriatric Medicine) Alvis Jourdain, MD as Consulting Physician (Gastroenterology) Trent Frizzle, MD as Consulting Physician (Urology) Pasqual Bone, MD as Consulting Physician (Cardiology)  PLACE OF SERVICE:  [facility]  Advanced Directive information    Allergies  Allergen Reactions   Zoloft [Sertraline Hcl] Other (See Comments)    Lost points in time while taking the medication so weaned himself off of it   Codeine Itching and Swelling    Chief Complaint  Patient presents with   Annual Exam    Discuss staying tried all the time , he said that he can sleep and feel like he hasn't rested he said that has been discuss with cardiology also    HPI:  Discussed the use of AI scribe software for clinical note transcription with the patient, who gave verbal consent to proceed.  History of Present Illness Tom Lane is a 63 year old male who presents for an annual physical exam.  He has a history of benign prostatic hypertrophy (BPH) and is currently taking Flomax . Without Flomax , he experiences significant difficulty urinating. Occasionally, if he holds his urine for a long time, he feels the need to urinate again shortly after finishing.  He is a smoker and experiences fatigue and shortness of breath upon exertion, such as walking or running long distances, but recovers after resting. He drinks two to three cups of coffee in the morning, No chest pain or significant changes in bowel habits.  He is followed by cardiology every 3 months but it does not appear we have been sent the most recent notes.   He fell down a flight of stairs two months ago, landing on his knee, which remains tender and occasionally clicks. He uses CBD products to manage the pain, which he reports as effective. Has normal ROM and pain has significantly improved.   He does not consume alcohol and reports regular bowel movements  without changes in pattern. No snoring or symptoms of sleep apnea, although his wife snores.   Review of Systems:  Review of Systems  Constitutional:  Positive for malaise/fatigue. Negative for chills, fever and weight loss.  HENT:  Negative for tinnitus.   Respiratory:  Negative for cough, sputum production and shortness of breath.   Cardiovascular:  Negative for chest pain, palpitations and leg swelling.  Gastrointestinal:  Negative for abdominal pain, constipation, diarrhea and heartburn.  Genitourinary:  Negative for dysuria, frequency and urgency.  Musculoskeletal:  Negative for back pain, falls, joint pain and myalgias.  Skin: Negative.   Neurological:  Negative for dizziness and headaches.  Psychiatric/Behavioral:  Negative for depression and memory loss. The patient does not have insomnia.     Past Medical History:  Diagnosis Date   Chest pain, unspecified    Chronic airway obstruction, not elsewhere classified    Chronic kidney disease    renal and ureteral stones   Depressive disorder, not elsewhere classified    Dermatophytosis of the body    Dyspepsia and other specified disorders of function of stomach    Headache    frequently, daily    History of kidney stones    Obesity, unspecified    Other abnormal blood chemistry    Other and unspecified hyperlipidemia    Other malaise and fatigue    Overweight(278.02)    Pain in limb    Psychosexual dysfunction with inhibited sexual excitement    Tobacco use disorder  Unspecified disorder of skin and subcutaneous tissue    Unspecified essential hypertension    Wheezing    Past Surgical History:  Procedure Laterality Date   ANAL FISSURE REPAIR  1985   CYSTOSCOPY WITH STENT PLACEMENT Right 04/24/2016   Procedure: CYSTOSCOPY WITH STENT PLACEMENT;  Surgeon: Trent Frizzle, MD;  Location: [facility];  Service: Urology;  Laterality: Right;   CYSTOSCOPY/RETROGRADE/URETEROSCOPY/STONE EXTRACTION WITH BASKET Right 04/24/2016    Procedure: CYSTOSCOPY/RETROGRADE/URETEROSCOPY/STONE EXTRACTION WITH BASKETAND STENT PLACEMENT;  Surgeon: Trent Frizzle, MD;  Location: [facility];  Service: Urology;  Laterality: Right;   HOLMIUM LASER APPLICATION Right 04/24/2016   Procedure: HOLMIUM LASER APPLICATION;  Surgeon: Trent Frizzle, MD;  Location: [facility];  Service: Urology;  Laterality: Right;   right renal stone removed  1983   Dr Enrigue Harvard   Social History:   reports that he has been smoking cigarettes. He has a 20 pack-year smoking history. He has never used smokeless tobacco. He reports current alcohol use. He reports that he does not use drugs.  Family History  Problem Relation Age of Onset   Diabetes Father    Stroke Father    Stroke Sister    Diabetes Sister     Medications: Patient's Medications  New Prescriptions   No medications on file  Previous Medications   ALBUTEROL  (VENTOLIN  HFA) 108 (90 BASE) MCG/ACT INHALER    Inhale 2 puffs into the lungs every 6 (six) hours as needed for wheezing or shortness of breath.   AMLODIPINE  (NORVASC ) 10 MG TABLET    Take 1 tablet (10 mg total) by mouth daily.   ASPIRIN 81 MG TABLET    Take 81 mg by mouth daily.    GARLIC PO    Take by mouth.   MECLIZINE  (ANTIVERT ) 25 MG TABLET    Take 1 tablet (25 mg total) by mouth daily.   METOPROLOL SUCCINATE 25 MG CS24    Take 25 mg by mouth daily at 6 (six) AM.   MULTIVITAMIN (ONE-A-DAY MEN'S) TABS    Take 1 tablet by mouth daily.    OMEGA-3 FATTY ACIDS (FISH OIL) 1200 MG CAPS    Take 1,200 mg by mouth daily.    ROSUVASTATIN  (CRESTOR ) 40 MG TABLET    Take 1 tablet by mouth once daily   TAMSULOSIN  (FLOMAX ) 0.4 MG CAPS CAPSULE    TAKE 1 CAPSULE BY MOUTH ONCE DAILY  Modified Medications   No medications on file  Discontinued Medications   No medications on file    Physical Exam:  Vitals:   08/24/23 0852  BP: 118/76  Pulse: 87  SpO2: 92%  Weight: 222 lb 9.6 oz (101 kg)  Height: 5\' 11"  (1.803 m)   Body mass index is 31.05  kg/m. Wt Readings from Last 3 Encounters:  08/24/23 222 lb 9.6 oz (101 kg)  02/23/23 225 lb (102.1 kg)  08/18/22 226 lb (102.5 kg)    Physical Exam Constitutional:      General: He is not in acute distress.    Appearance: He is well-developed. He is not diaphoretic.  HENT:     Head: Normocephalic and atraumatic.     Right Ear: External ear normal.     Left Ear: External ear normal.     Mouth/Throat:     Pharynx: No oropharyngeal exudate.  Eyes:     Conjunctiva/sclera: Conjunctivae normal.     Pupils: Pupils are equal, round, and reactive to light.  Cardiovascular:     Rate and Rhythm: Normal rate and  regular rhythm.     Heart sounds: Murmur heard.  Pulmonary:     Effort: Pulmonary effort is normal.     Breath sounds: Normal breath sounds.  Abdominal:     General: Bowel sounds are normal.     Palpations: Abdomen is soft.  Musculoskeletal:        General: No tenderness.     Cervical back: Normal range of motion and neck supple.     Right lower leg: No edema.     Left lower leg: No edema.  Skin:    General: Skin is warm and dry.  Neurological:     Mental Status: He is alert and oriented to person, place, and time.     Labs reviewed: Basic Metabolic Panel: Recent Labs    02/23/23 1018  NA 141  K 4.2  CL 106  CO2 28  GLUCOSE 98  BUN 27*  CREATININE 1.34  CALCIUM  10.1   Liver Function Tests: Recent Labs    02/23/23 1018  AST 18  ALT 25  BILITOT 0.3  PROT 6.3   No results for input(s): "LIPASE", "AMYLASE" in the last 8760 hours. No results for input(s): "AMMONIA" in the last 8760 hours. CBC: Recent Labs    02/23/23 1018  WBC 7.4  NEUTROABS 4,040  HGB 16.7  HCT 50.2*  MCV 91.4  PLT 196   Lipid Panel: No results for input(s): "CHOL", "HDL", "LDLCALC", "TRIG", "CHOLHDL", "LDLDIRECT" in the last 8760 hours. TSH: No results for input(s): "TSH" in the last 8760 hours. A1C: Lab Results  Component Value Date   HGBA1C 5.1 12/12/2019      Assessment/Plan 1. Preventative health care (Primary) Completed today -wellness visit completed today, The patient was counseled regarding the appropriate use of alcohol, regular self-examination of the breasts on a monthly basis, prevention of dental and periodontal disease, diet, regular sustained exercise for at least 30 minutes 5 times per week, testicular self-examination on a monthly basis,smoking cessation, tobacco use,  and recommended schedule for GI hemoccult testing, colonoscopy, cholesterol, thyroid  and diabetes screening. Discussed smoking cessation  Proper caffeine intake  2. Pure hypercholesterolemia Continues on crestor  daily - Lipid panel - COMPLETE METABOLIC PANEL WITHOUT GFR  3. Essential hypertension, benign -Blood pressure well controlled, goal bp <140/90 Continue current medications and dietary modifications follow metabolic panel - COMPLETE METABOLIC PANEL WITHOUT GFR - CBC with Differential/Platelet  4. Tobacco use disorder, continuous Cessation encouraged - Pneumococcal conjugate vaccine 20-valent (Prevnar 20)  5. Benign prostatic hyperplasia with urinary hesitancy No new symptoms, continue son flomax  - PSA  6. Chronic obstructive pulmonary disease, unspecified COPD type (HCC) -no recent flares, notes decrease in activity tolerance.   Return in about 6 months (around 02/24/2024) for routine follow up.  Kendle Erker K. Denney Fisherman [facility] [phone]

## 2023-08-24 NOTE — Patient Instructions (Signed)
 To get shingles vaccine and TDAP at your local pharmacy

## 2023-08-25 ENCOUNTER — Ambulatory Visit: Payer: Self-pay | Admitting: Nurse Practitioner

## 2023-08-25 LAB — COMPLETE METABOLIC PANEL WITHOUT GFR
AG Ratio: 2.2 (calc) (ref 1.0–2.5)
ALT: 26 U/L (ref 9–46)
AST: 17 U/L (ref 10–35)
Albumin: 4.2 g/dL (ref 3.6–5.1)
Alkaline phosphatase (APISO): 70 U/L (ref 35–144)
BUN: 24 mg/dL (ref 7–25)
CO2: 27 mmol/L (ref 20–32)
Calcium: 9.9 mg/dL (ref 8.6–10.3)
Chloride: 108 mmol/L (ref 98–110)
Creat: 0.97 mg/dL (ref 0.70–1.35)
Globulin: 1.9 g/dL (ref 1.9–3.7)
Glucose, Bld: 95 mg/dL (ref 65–99)
Potassium: 4.1 mmol/L (ref 3.5–5.3)
Sodium: 141 mmol/L (ref 135–146)
Total Bilirubin: 0.3 mg/dL (ref 0.2–1.2)
Total Protein: 6.1 g/dL (ref 6.1–8.1)

## 2023-08-25 LAB — LIPID PANEL
Cholesterol: 141 mg/dL (ref <200)
HDL: 44 mg/dL (ref >=40)
LDL Cholesterol (Calc): 68 mg/dL
Non-HDL Cholesterol (Calc): 97 mg/dL (ref <130)
Total CHOL/HDL Ratio: 3.2 (calc) (ref <5.0)
Triglycerides: 236 mg/dL — ABNORMAL HIGH (ref <150)

## 2023-08-25 LAB — CBC WITH DIFFERENTIAL/PLATELET
Absolute Lymphocytes: 1902 {cells}/uL (ref 850–3900)
Absolute Monocytes: 636 {cells}/uL (ref 200–950)
Basophils Absolute: 30 {cells}/uL (ref 0–200)
Basophils Relative: 0.4 %
Eosinophils Absolute: 237 {cells}/uL (ref 15–500)
Eosinophils Relative: 3.2 %
HCT: 48.9 % (ref 38.5–50.0)
Hemoglobin: 16.4 g/dL (ref 13.2–17.1)
MCH: 30.8 pg (ref 27.0–33.0)
MCHC: 33.5 g/dL (ref 32.0–36.0)
MCV: 91.7 fL (ref 80.0–100.0)
MPV: 10.4 fL (ref 7.5–12.5)
Monocytes Relative: 8.6 %
Neutro Abs: 4595 {cells}/uL (ref 1500–7800)
Neutrophils Relative %: 62.1 %
Platelets: 182 10*3/uL (ref 140–400)
RBC: 5.33 10*6/uL (ref 4.20–5.80)
RDW: 13.1 % (ref 11.0–15.0)
Total Lymphocyte: 25.7 %
WBC: 7.4 10*3/uL (ref 3.8–10.8)

## 2023-08-25 LAB — PSA: PSA: 1.85 ng/mL (ref <=4.00)

## 2023-08-31 ENCOUNTER — Ambulatory Visit: Admitting: Adult Health

## 2023-08-31 ENCOUNTER — Encounter: Payer: Self-pay | Admitting: Adult Health

## 2023-08-31 DIAGNOSIS — F17210 Nicotine dependence, cigarettes, uncomplicated: Secondary | ICD-10-CM | POA: Diagnosis not present

## 2023-08-31 NOTE — Patient Instructions (Signed)

## 2023-08-31 NOTE — Progress Notes (Signed)
  Virtual Visit via Telephone Note  I connected with ZYRION COEY , 08/31/23 9:33 AM by a telemedicine application and verified that I am speaking with the correct person using two identifiers.  Location: Patient: home Provider: home   I discussed the limitations of evaluation and management by telemedicine and the availability of in person appointments. The patient expressed understanding and agreed to proceed.   Shared Decision Making Visit Lung Cancer Screening Program [phone])   Eligibility: 63 y.o. Pack Years Smoking History Calculation = 80 pack years  (# packs/per year x # years smoked) Recent History of coughing up blood  no Unexplained weight loss? no ( >Than 15 pounds within the last 6 months ) Prior History Lung / other cancer no (Diagnosis within the last 5 years already requiring surveillance chest CT Scans). Smoking Status Current Smoker  Visit Components: Discussion included one or more decision making aids. YES Discussion included risk/benefits of screening. YES Discussion included potential follow up diagnostic testing for abnormal scans. YES Discussion included meaning and risk of over diagnosis. YES Discussion included meaning and risk of False Positives. YES Discussion included meaning of total radiation exposure. YES  Counseling Included: Importance of adherence to annual lung cancer LDCT screening. YES Impact of comorbidities on ability to participate in the program. YES Ability and willingness to under diagnostic treatment. YES  Smoking Cessation Counseling: Current Smokers:  Discussed importance of smoking cessation. yes Information about tobacco cessation classes and interventions provided to patient. yes Patient provided with "ticket" for LDCT Scan. yes Symptomatic Patient. NO Diagnosis Code: Tobacco Use Z72.0 Asymptomatic Patient yes  Counseling - 4 minutes of smoking cessation counseling (CT Chest Lung Cancer Screening Low Dose W/O CM)  [id number]  Z12.2-Screening of respiratory organs Z87.891-Personal history of nicotine  dependence   Cullen Dose 08/31/23

## 2023-09-07 DIAGNOSIS — R072 Precordial pain: Secondary | ICD-10-CM | POA: Diagnosis not present

## 2023-09-07 DIAGNOSIS — I425 Other restrictive cardiomyopathy: Secondary | ICD-10-CM | POA: Diagnosis not present

## 2023-09-07 DIAGNOSIS — I10 Essential (primary) hypertension: Secondary | ICD-10-CM | POA: Diagnosis not present

## 2023-09-07 DIAGNOSIS — I340 Nonrheumatic mitral (valve) insufficiency: Secondary | ICD-10-CM | POA: Diagnosis not present

## 2023-09-14 ENCOUNTER — Ambulatory Visit
Admission: RE | Admit: 2023-09-14 | Discharge: 2023-09-14 | Disposition: A | Discharge status: Home / Self Care | Source: Ambulatory Visit | Attending: Acute Care | Admitting: Acute Care

## 2023-09-14 DIAGNOSIS — F17210 Nicotine dependence, cigarettes, uncomplicated: Secondary | ICD-10-CM

## 2023-09-14 DIAGNOSIS — Z87891 Personal history of nicotine dependence: Secondary | ICD-10-CM | POA: Diagnosis not present

## 2023-09-14 DIAGNOSIS — Z122 Encounter for screening for malignant neoplasm of respiratory organs: Secondary | ICD-10-CM | POA: Diagnosis not present

## 2023-10-01 ENCOUNTER — Other Ambulatory Visit: Payer: Self-pay

## 2023-10-01 DIAGNOSIS — Z87891 Personal history of nicotine dependence: Secondary | ICD-10-CM

## 2023-10-01 DIAGNOSIS — F17210 Nicotine dependence, cigarettes, uncomplicated: Secondary | ICD-10-CM

## 2023-10-01 DIAGNOSIS — Z122 Encounter for screening for malignant neoplasm of respiratory organs: Secondary | ICD-10-CM

## 2023-10-05 ENCOUNTER — Other Ambulatory Visit: Payer: Self-pay | Admitting: Nurse Practitioner

## 2023-10-05 DIAGNOSIS — N401 Enlarged prostate with lower urinary tract symptoms: Secondary | ICD-10-CM

## 2023-11-29 ENCOUNTER — Other Ambulatory Visit: Payer: Self-pay | Admitting: Nurse Practitioner

## 2024-01-15 ENCOUNTER — Other Ambulatory Visit: Payer: Self-pay | Admitting: Nurse Practitioner

## 2024-01-15 DIAGNOSIS — J449 Chronic obstructive pulmonary disease, unspecified: Secondary | ICD-10-CM

## 2024-02-29 ENCOUNTER — Ambulatory Visit: Payer: Self-pay | Admitting: Nurse Practitioner

## 2024-03-04 ENCOUNTER — Other Ambulatory Visit: Payer: Self-pay | Admitting: Nurse Practitioner

## 2024-03-04 DIAGNOSIS — N401 Enlarged prostate with lower urinary tract symptoms: Secondary | ICD-10-CM

## 2024-04-18 ENCOUNTER — Ambulatory Visit: Admitting: Nurse Practitioner

## 2024-04-18 ENCOUNTER — Encounter: Payer: Self-pay | Admitting: Nurse Practitioner

## 2024-04-18 VITALS — BP 136/78 | HR 88 | Temp 97.4°F | Resp 18 | Ht 71.0 in | Wt 225.6 lb

## 2024-04-18 DIAGNOSIS — R3911 Hesitancy of micturition: Secondary | ICD-10-CM | POA: Diagnosis not present

## 2024-04-18 DIAGNOSIS — J449 Chronic obstructive pulmonary disease, unspecified: Secondary | ICD-10-CM

## 2024-04-18 DIAGNOSIS — F17209 Nicotine dependence, unspecified, with unspecified nicotine-induced disorders: Secondary | ICD-10-CM

## 2024-04-18 DIAGNOSIS — N401 Enlarged prostate with lower urinary tract symptoms: Secondary | ICD-10-CM | POA: Diagnosis not present

## 2024-04-18 DIAGNOSIS — Z23 Encounter for immunization: Secondary | ICD-10-CM | POA: Diagnosis not present

## 2024-04-18 DIAGNOSIS — E7800 Pure hypercholesterolemia, unspecified: Secondary | ICD-10-CM | POA: Diagnosis not present

## 2024-04-18 DIAGNOSIS — I10 Essential (primary) hypertension: Secondary | ICD-10-CM

## 2024-04-18 DIAGNOSIS — M150 Primary generalized (osteo)arthritis: Secondary | ICD-10-CM | POA: Diagnosis not present

## 2024-04-18 MED ORDER — FINASTERIDE 5 MG PO TABS: 5.0000 mg | ORAL_TABLET | Freq: Every day | ORAL | 1 refills | Status: AC

## 2024-04-18 NOTE — Progress Notes (Signed)
 "   Careteam: Patient Care Team: [name], NP as PCP - General (Geriatric Medicine) Rollin Dover, MD as Consulting Physician (Gastroenterology) Matilda Senior, MD as Consulting Physician (Urology) Claudene Pacific, MD as Consulting Physician (Cardiology)  PLACE OF SERVICE:  [facility]  Advanced Directive information    Allergies[1]  Chief Complaint  Patient presents with   Medical Management of Chronic Issues    6 month,     HPI:  Discussed the use of AI scribe software for clinical note transcription with the patient, who gave verbal consent to proceed.  History of Present Illness Tom Lane is a 64 year old male with benign prostatic hyperplasia who presents for a six-month follow-up.  He experiences increased nocturia, urinating three to four times per night compared to once or twice previously, despite not drinking fluids before bed. He is currently taking Tamsulosin  (Flomax ) but noted a worsening of symptoms when he ran out of the medication for two days, stating, 'If I don't, I can't pee at all.'  He has a history of COPD and uses albuterol  as needed, approximately two to three times a week, especially when experiencing wheezing. He also experiences shortness of breath with exertion but reports no recent changes in his respiratory symptoms.  He has hypertension and takes Norvasc  10 mg daily and Metoprolol 25 mg daily.   He also has hyperlipidemia and takes Rosuvastatin .   He reports joint aches that were previously managed with Tylenol , but he feels it is no longer effective. He takes Tylenol  before and during his work shift, estimating the dose to be around 200-250 mg, though he is unsure of the exact amount. He also mentions using collagen peptides, which he feels have reduced joint popping and cracking.  He continues to smoke cigarettes and has not reduced his smoking. He is up to date with lung cancer screening, having completed a CT scan in June 2025,  with the next due in June 2026.   No changes in bowel habits, constipation, or difficulty chewing or swallowing. He is not currently seeing an eye doctor but plans to do so.    Review of Systems:  Review of Systems  Constitutional:  Negative for chills, fever and weight loss.  HENT:  Negative for tinnitus.   Respiratory:  Negative for cough, sputum production and shortness of breath.   Cardiovascular:  Negative for chest pain, palpitations and leg swelling.  Gastrointestinal:  Negative for abdominal pain, constipation, diarrhea and heartburn.  Genitourinary:  Positive for frequency (at night). Negative for dysuria and urgency.  Musculoskeletal:  Negative for back pain, falls, joint pain and myalgias.  Skin: Negative.   Neurological:  Negative for dizziness and headaches.  Psychiatric/Behavioral:  Negative for depression and memory loss. The patient does not have insomnia.     Past Medical History:  Diagnosis Date   Chest pain, unspecified    Chronic airway obstruction, not elsewhere classified    Chronic kidney disease    renal and ureteral stones   Depressive disorder, not elsewhere classified    Dermatophytosis of the body    Dyspepsia and other specified disorders of function of stomach    Headache    frequently, daily    History of kidney stones    Obesity, unspecified    Other abnormal blood chemistry    Other and unspecified hyperlipidemia    Other malaise and fatigue    Overweight(278.02)    Pain in limb    Psychosexual dysfunction with inhibited  sexual excitement    Tobacco use disorder    Unspecified disorder of skin and subcutaneous tissue    Unspecified essential hypertension    Wheezing    Past Surgical History:  Procedure Laterality Date   ANAL FISSURE REPAIR  1985   CYSTOSCOPY WITH STENT PLACEMENT Right 04/24/2016   Procedure: CYSTOSCOPY WITH STENT PLACEMENT;  Surgeon: Garnette Shack, MD;  Location: [facility];  Service: Urology;  Laterality: Right;    CYSTOSCOPY/RETROGRADE/URETEROSCOPY/STONE EXTRACTION WITH BASKET Right 04/24/2016   Procedure: CYSTOSCOPY/RETROGRADE/URETEROSCOPY/STONE EXTRACTION WITH BASKETAND STENT PLACEMENT;  Surgeon: Garnette Shack, MD;  Location: [facility];  Service: Urology;  Laterality: Right;   HOLMIUM LASER APPLICATION Right 04/24/2016   Procedure: HOLMIUM LASER APPLICATION;  Surgeon: Garnette Shack, MD;  Location: [facility];  Service: Urology;  Laterality: Right;   right renal stone removed  1983   Dr Mardy   Social History:   reports that he has been smoking cigarettes. He has a 20 pack-year smoking history. He has never used smokeless tobacco. He reports current alcohol use. He reports that he does not use drugs.  Family History  Problem Relation Age of Onset   Diabetes Father    Stroke Father    Stroke Sister    Diabetes Sister     Medications: Patient's Medications  New Prescriptions   No medications on file  Previous Medications   ALBUTEROL  (VENTOLIN  HFA) 108 (90 BASE) MCG/ACT INHALER    INHALE 2 PUFFS BY MOUTH EVERY 6 HOURS AS NEEDED FOR WHEEZING AND FOR SHORTNESS OF BREATH   AMLODIPINE  (NORVASC ) 10 MG TABLET    Take 1 tablet (10 mg total) by mouth daily.   ASPIRIN 81 MG TABLET    Take 81 mg by mouth daily.    GARLIC PO    Take by mouth.   MECLIZINE  (ANTIVERT ) 25 MG TABLET    Take 1 tablet by mouth once daily   METOPROLOL SUCCINATE 25 MG CS24    Take 25 mg by mouth daily at 6 (six) AM.   MULTIVITAMIN (ONE-A-DAY MEN'S) TABS    Take 1 tablet by mouth daily.    OMEGA-3 FATTY ACIDS (FISH OIL) 1200 MG CAPS    Take 1,200 mg by mouth daily.    ROSUVASTATIN  (CRESTOR ) 40 MG TABLET    Take 1 tablet by mouth once daily   TAMSULOSIN  (FLOMAX ) 0.4 MG CAPS CAPSULE    Take 1 capsule by mouth once daily  Modified Medications   No medications on file  Discontinued Medications   No medications on file    Physical Exam:  Vitals:   04/18/24 0903  BP: 136/78  Pulse: 88  Resp: 18  Temp: (!) 97.4 F (36.3 C)   SpO2: 96%  Weight: 225 lb 9.6 oz (102.3 kg)  Height: 5' 11 (1.803 m)   Body mass index is 31.46 kg/m. Wt Readings from Last 3 Encounters:  04/18/24 225 lb 9.6 oz (102.3 kg)  08/24/23 222 lb 9.6 oz (101 kg)  02/23/23 225 lb (102.1 kg)    Physical Exam Constitutional:      General: He is not in acute distress.    Appearance: He is well-developed. He is not diaphoretic.  HENT:     Head: Normocephalic and atraumatic.     Right Ear: External ear normal.     Left Ear: External ear normal.     Mouth/Throat:     Pharynx: No oropharyngeal exudate.  Eyes:     Conjunctiva/sclera: Conjunctivae normal.     Pupils:  Pupils are equal, round, and reactive to light.  Cardiovascular:     Rate and Rhythm: Normal rate and regular rhythm.     Heart sounds: Normal heart sounds.  Pulmonary:     Effort: Pulmonary effort is normal.     Breath sounds: Normal breath sounds.  Abdominal:     General: Bowel sounds are normal.     Palpations: Abdomen is soft.  Musculoskeletal:        General: No tenderness.     Cervical back: Normal range of motion and neck supple.     Right lower leg: No edema.     Left lower leg: No edema.  Skin:    General: Skin is warm and dry.  Neurological:     Mental Status: He is alert and oriented to person, place, and time.     Labs reviewed: Basic Metabolic Panel: Recent Labs    08/24/23 0927  NA 141  K 4.1  CL 108  CO2 27  GLUCOSE 95  BUN 24  CREATININE 0.97  CALCIUM  9.9   Liver Function Tests: Recent Labs    08/24/23 0927  AST 17  ALT 26  BILITOT 0.3  PROT 6.1   No results for input(s): LIPASE, AMYLASE in the last 8760 hours. No results for input(s): AMMONIA in the last 8760 hours. CBC: Recent Labs    08/24/23 0927  WBC 7.4  NEUTROABS 4,595  HGB 16.4  HCT 48.9  MCV 91.7  PLT 182   Lipid Panel: Recent Labs    08/24/23 0927  CHOL 141  HDL 44  LDLCALC 68  TRIG 236*  CHOLHDL 3.2   TSH: No results for input(s): TSH in  the last 8760 hours. A1C: Lab Results  Component Value Date   HGBA1C 5.1 12/12/2019     Assessment/Plan Assessment and Plan Assessment & Plan Benign prostatic hyperplasia with lower urinary tract symptoms Worsening nocturia despite tamsulosin . Symptoms unchanged after missing medication for two days. - Continue tamsulosin  (Flomax ). - Added finasteride  (Proscar ). - Ordered PSA level. - Advised increased water intake earlier in the day, reduced intake after dinner.  Chronic obstructive pulmonary disease COPD well-managed with occasional albuterol  use. No recent exacerbations. - Continue albuterol  as needed. - Encouraged smoking cessation. - Recommended OTC antihistamines for allergies.  Essential hypertension Hypertension well-controlled on Norvasc  and metoprolol. - Continue Norvasc  10 mg daily. - Continue metoprolol 25 mg daily.  Pure hypercholesterolemia - Continue rosuvastatin  and dietary modifications  - Rechecked lipid panel today.  Primary generalized osteoarthritis - Increase Tylenol  to 325 mg, two tablets every six hours as needed. - Advised caution with ibuprofen and to use sparingly not long term.  - Encouraged mindful movements and lifestyle modifications.  Tobacco use disorder Continues smoking without reduction. Cessation recommended. - Encouraged smoking cessation.  General Health Maintenance Routine health maintenance discussed - Encouraged routine eye exams. - Confirmed up-to-date colonoscopy status.   Return in about 6 months (around 10/16/2024) for CPE.  Emillee Talsma K. Caro BODILY [facility] [phone]      [1]  Allergies Allergen Reactions   Zoloft [Sertraline Hcl] Other (See Comments)    Lost points in time while taking the medication so weaned himself off of it   Codeine Itching and Swelling   "

## 2024-04-18 NOTE — Patient Instructions (Signed)
 Tylenol  325 mg 2 tablets every 6 hours as needed for pain.

## 2024-04-19 ENCOUNTER — Ambulatory Visit: Payer: Self-pay | Admitting: Nurse Practitioner

## 2024-04-19 LAB — CBC WITH DIFFERENTIAL/PLATELET
Absolute Lymphocytes: 2030 {cells}/uL (ref 850–3900)
Absolute Monocytes: 563 {cells}/uL (ref 200–950)
Basophils Absolute: 27 {cells}/uL (ref 0–200)
Basophils Relative: 0.4 %
Eosinophils Absolute: 208 {cells}/uL (ref 15–500)
Eosinophils Relative: 3.1 %
HCT: 53 % — ABNORMAL HIGH (ref 39.4–51.1)
Hemoglobin: 17.5 g/dL — ABNORMAL HIGH (ref 13.2–17.1)
MCH: 30.5 pg (ref 27.0–33.0)
MCHC: 33 g/dL (ref 31.6–35.4)
MCV: 92.5 fL (ref 81.4–101.7)
MPV: 10.2 fL (ref 7.5–12.5)
Monocytes Relative: 8.4 %
Neutro Abs: 3873 {cells}/uL (ref 1500–7800)
Neutrophils Relative %: 57.8 %
Platelets: 196 Thousand/uL (ref 140–400)
RBC: 5.73 Million/uL (ref 4.20–5.80)
RDW: 13.3 % (ref 11.0–15.0)
Total Lymphocyte: 30.3 %
WBC: 6.7 Thousand/uL (ref 3.8–10.8)

## 2024-04-19 LAB — COMPREHENSIVE METABOLIC PANEL WITH GFR
AG Ratio: 2.3 (calc) (ref 1.0–2.5)
ALT: 24 U/L (ref 9–46)
AST: 15 U/L (ref 10–35)
Albumin: 4.6 g/dL (ref 3.6–5.1)
Alkaline phosphatase (APISO): 74 U/L (ref 35–144)
BUN: 14 mg/dL (ref 7–25)
CO2: 28 mmol/L (ref 20–32)
Calcium: 10.2 mg/dL (ref 8.6–10.3)
Chloride: 104 mmol/L (ref 98–110)
Creat: 0.97 mg/dL (ref 0.70–1.35)
Globulin: 2 g/dL (ref 1.9–3.7)
Glucose, Bld: 106 mg/dL — ABNORMAL HIGH (ref 65–99)
Potassium: 4.2 mmol/L (ref 3.5–5.3)
Sodium: 141 mmol/L (ref 135–146)
Total Bilirubin: 0.4 mg/dL (ref 0.2–1.2)
Total Protein: 6.6 g/dL (ref 6.1–8.1)
eGFR: 88 mL/min/1.73m2

## 2024-04-19 LAB — LIPID PANEL
Cholesterol: 153 mg/dL
HDL: 66 mg/dL
LDL Cholesterol (Calc): 68 mg/dL
Non-HDL Cholesterol (Calc): 87 mg/dL
Total CHOL/HDL Ratio: 2.3 (calc)
Triglycerides: 106 mg/dL

## 2024-04-19 LAB — PSA: PSA: 1.73 ng/mL

## 2024-10-31 ENCOUNTER — Encounter: Admitting: Nurse Practitioner
